# Patient Record
Sex: Male | Born: 1980
Health system: Southern US, Community
[De-identification: ages and names within clinical notes are randomized; demographics above are authoritative.]

## PROBLEM LIST (undated history)

## (undated) DIAGNOSIS — S86892S Other injury of other muscle(s) and tendon(s) at lower leg level, left leg, sequela: Secondary | ICD-10-CM

## (undated) DIAGNOSIS — S86012A Strain of left Achilles tendon, initial encounter: Secondary | ICD-10-CM

## (undated) DIAGNOSIS — M179 Osteoarthritis of knee, unspecified: Secondary | ICD-10-CM

## (undated) DIAGNOSIS — L409 Psoriasis, unspecified: Secondary | ICD-10-CM

## (undated) DIAGNOSIS — M1710 Unilateral primary osteoarthritis, unspecified knee: Secondary | ICD-10-CM

## (undated) HISTORY — DX: Other injury of other muscle(s) and tendon(s) at lower leg level, left leg, sequela: S86.892S

## (undated) HISTORY — DX: Osteoarthritis of knee, unspecified: M17.9

## (undated) HISTORY — PX: NASAL SEPTUM SURGERY: SHX37

## (undated) HISTORY — PX: CYST EXCISION: SHX5701

## (undated) HISTORY — DX: Unilateral primary osteoarthritis, unspecified knee: M17.10

## (undated) HISTORY — PX: FOOT FRACTURE SURGERY: SHX645

---

## 2001-09-09 ENCOUNTER — Ambulatory Visit (HOSPITAL_BASED_OUTPATIENT_CLINIC_OR_DEPARTMENT_OTHER): Admission: RE | Admit: 2001-09-09 | Discharge: 2001-09-09 | Payer: Self-pay | Admitting: Otolaryngology

## 2002-03-19 ENCOUNTER — Emergency Department (HOSPITAL_COMMUNITY): Admission: EM | Admit: 2002-03-19 | Discharge: 2002-03-19 | Payer: Self-pay | Admitting: Emergency Medicine

## 2002-03-19 ENCOUNTER — Encounter: Payer: Self-pay | Admitting: Emergency Medicine

## 2011-07-23 DIAGNOSIS — S335XXA Sprain of ligaments of lumbar spine, initial encounter: Secondary | ICD-10-CM | POA: Insufficient documentation

## 2013-04-18 ENCOUNTER — Emergency Department: Payer: Self-pay | Admitting: Internal Medicine

## 2013-04-18 LAB — SEDIMENTATION RATE: Erythrocyte Sed Rate: 1 mm/hr (ref 0–15)

## 2013-04-18 LAB — URIC ACID: Uric Acid: 7.7 mg/dL — ABNORMAL HIGH (ref 3.5–7.2)

## 2013-05-10 ENCOUNTER — Emergency Department (HOSPITAL_COMMUNITY)
Admission: EM | Admit: 2013-05-10 | Discharge: 2013-05-10 | Disposition: A | Discharge status: Home / Self Care | Payer: PRIVATE HEALTH INSURANCE | Attending: Emergency Medicine | Admitting: Emergency Medicine

## 2013-05-10 ENCOUNTER — Encounter (HOSPITAL_COMMUNITY): Payer: Self-pay | Admitting: Emergency Medicine

## 2013-05-10 DIAGNOSIS — Z791 Long term (current) use of non-steroidal anti-inflammatories (NSAID): Secondary | ICD-10-CM | POA: Insufficient documentation

## 2013-05-10 DIAGNOSIS — M7660 Achilles tendinitis, unspecified leg: Secondary | ICD-10-CM | POA: Insufficient documentation

## 2013-05-10 DIAGNOSIS — M7661 Achilles tendinitis, right leg: Secondary | ICD-10-CM

## 2013-05-10 MED ORDER — OXYCODONE-ACETAMINOPHEN 5-325 MG PO TABS: 1.0000 | ORAL_TABLET | ORAL | Status: DC | PRN

## 2013-05-10 MED ORDER — IBUPROFEN 800 MG PO TABS: 800.0000 mg | ORAL_TABLET | Freq: Three times a day (TID) | ORAL | Status: DC

## 2013-05-10 NOTE — ED Notes (Signed)
Pt alert, arrives from home, c/o left foot(tendon) pain, onset was yesterday, denies trauma or injury, resp even unlabored, skin pwd

## 2013-05-18 ENCOUNTER — Ambulatory Visit (INDEPENDENT_AMBULATORY_CARE_PROVIDER_SITE_OTHER): Payer: 59 | Admitting: Internal Medicine

## 2013-05-18 VITALS — BP 124/88 | HR 82 | Temp 98.3°F | Resp 18 | Ht 70.0 in | Wt 220.0 lb

## 2013-05-18 DIAGNOSIS — M7660 Achilles tendinitis, unspecified leg: Secondary | ICD-10-CM

## 2013-05-18 DIAGNOSIS — M7661 Achilles tendinitis, right leg: Secondary | ICD-10-CM

## 2013-05-18 MED ORDER — OXYCODONE-ACETAMINOPHEN 5-325 MG PO TABS: 1.0000 | ORAL_TABLET | Freq: Four times a day (QID) | ORAL | Status: DC | PRN

## 2013-05-18 NOTE — Progress Notes (Signed)
  Subjective:    Patient ID: Frank Wall, male    DOB: Aug 19, 1981, 32 y.o.   MRN: [id number]  HPI Was in ER about 10 days ago with flare of achilles tendonitis. Was off feet for several days, was pain free after resting and ibuprofen/percocet. Had to move yesterday and started having recurrent pain last night. Has seen Dr. Brynda Greathouse from ortho in past and has an appointment with him in 9 days. [place]. Had complicated break of foot when he was 7. Has had prednisone in the past, but did not think it was helpful and it made him feel weird. Last week, took 2 percocet in morning, then a couple more per day for a couple of days. Was off for several days. Pain worse in the mornings.  Review of Systems     Objective:   Physical Exam Male in NAD, using crutches for ambulation.  Right foot flexion to 45 degrees, painful. Minimal achilles swelling, but exquisitely tender to touch. Negative Thompson's sign. Foot with normal color, temperature, distal pulses present, brisk cap refill.  Unable to bear weight without significant pain    Assessment & Plan:  Achilles tendinitis, right 1- continue to use crutches, with no weight bearing until he sees Dr. Brynda Greathouse (ortho) 2- Meds ordered this encounter  Medications  . oxyCODONE-acetaminophen (PERCOCET/ROXICET) 5-325 MG per tablet    Sig: Take 1 tablet by mouth every 6 (six) hours as needed for pain.    Dispense:  20 tablet    Refill:  0   reviewed nccsrs  Attending statement: I was fully involved in this evaluation, exam, and plan and have reviewed the document and agree. Robert P. Sandria Bales.D.

## 2013-05-18 NOTE — ED Provider Notes (Signed)
CSN: [id number]     Arrival date & time 05/10/13  0804 History   First MD Initiated Contact with Patient 05/10/13 [phone]     Chief Complaint  Patient presents with  . Foot Pain    Right   (Consider location/radiation/quality/duration/timing/severity/associated sxs/prior Treatment) Patient is a 32 y.o. male presenting with lower extremity pain. The history is provided by the patient. No language interpreter was used.  Foot Pain This is a recurrent problem. The current episode started yesterday. Pertinent negatives include no chills or fever. Associated symptoms comments: He has a history of Achilles tendonitis with recurrent symptoms that started yesterday. No known injury. Minimal swelling. Marland Kitchen    History reviewed. No pertinent past medical history. History reviewed. No pertinent past surgical history. Family History  Problem Relation Age of Onset  . Diabetes Father    History  Substance Use Topics  . Smoking status: Never Smoker   . Smokeless tobacco: Never Used  . Alcohol Use: No    Review of Systems  Constitutional: Negative for fever and chills.  Musculoskeletal: Negative.        See HPI  Skin: Negative.   Neurological: Negative.     Allergies  Review of patient's allergies indicates no known allergies.  Home Medications   Current Outpatient Rx  Name  Route  Sig  Dispense  Refill  . naproxen sodium (ANAPROX) 220 MG tablet   Oral   Take 220 mg by mouth 2 (two) times daily with a meal.         . ibuprofen (ADVIL,MOTRIN) 800 MG tablet   Oral   Take 1 tablet (800 mg total) by mouth 3 (three) times daily.   21 tablet   0   . oxyCODONE-acetaminophen (PERCOCET/ROXICET) 5-325 MG per tablet   Oral   Take 1 tablet by mouth every 6 (six) hours as needed for pain.   20 tablet   0    BP 143/89  Pulse 98  Temp(Src) 98 F (36.7 C) (Oral)  Resp 16  Wt 220 lb (99.791 kg)  SpO2 100% Physical Exam  Constitutional: He is oriented to person, place, and time. He appears  well-developed and well-nourished.  Neck: Normal range of motion.  Pulmonary/Chest: Effort normal.  Musculoskeletal: Normal range of motion.  Significant lender to left posterior distal lower leg and ankle. No tendon laxity. Stable joint. NO redness or lesion.  Neurological: He is alert and oriented to person, place, and time.  Skin: Skin is warm and dry.  Psychiatric: He has a normal mood and affect.    ED Course  Procedures (including critical care time) Labs Review Labs Reviewed - No data to display Imaging Review No results found.  MDM   1. Tendonitis, Achilles, right    Recurrent Achilles tendonitis without complication. He has ortho follow up in place.     Arnoldo Hooker, PA-C 05/18/13 1953

## 2013-05-19 NOTE — ED Provider Notes (Signed)
Medical screening examination/treatment/procedure(s) were performed by non-physician practitioner and as supervising physician I was immediately available for consultation/collaboration.   Richardean Canal, MD 05/19/13 [phone]

## 2013-10-13 ENCOUNTER — Emergency Department: Payer: Self-pay | Admitting: Emergency Medicine

## 2013-10-18 ENCOUNTER — Emergency Department: Payer: Self-pay | Admitting: Internal Medicine

## 2014-03-05 ENCOUNTER — Emergency Department (HOSPITAL_COMMUNITY)
Admission: EM | Admit: 2014-03-05 | Discharge: 2014-03-05 | Disposition: A | Discharge status: Home / Self Care | Payer: PRIVATE HEALTH INSURANCE | Attending: Emergency Medicine | Admitting: Emergency Medicine

## 2014-03-05 ENCOUNTER — Emergency Department (HOSPITAL_COMMUNITY): Payer: PRIVATE HEALTH INSURANCE

## 2014-03-05 ENCOUNTER — Encounter (HOSPITAL_COMMUNITY): Payer: Self-pay | Admitting: Emergency Medicine

## 2014-03-05 DIAGNOSIS — M722 Plantar fascial fibromatosis: Secondary | ICD-10-CM | POA: Insufficient documentation

## 2014-03-05 DIAGNOSIS — Z791 Long term (current) use of non-steroidal anti-inflammatories (NSAID): Secondary | ICD-10-CM | POA: Insufficient documentation

## 2014-03-05 MED ORDER — OXYCODONE-ACETAMINOPHEN 5-325 MG PO TABS: 1.0000 | ORAL_TABLET | ORAL | Status: DC | PRN

## 2014-03-05 MED ORDER — OXYCODONE-ACETAMINOPHEN 5-325 MG PO TABS
2.0000 | ORAL_TABLET | Freq: Once | ORAL | Status: AC
  Administered 2014-03-05: 2 via ORAL
  Filled 2014-03-05: qty 2

## 2014-03-05 NOTE — ED Notes (Signed)
Per pt, states history of right foot problems-no injury 4 days ago, now right foot swollen, difficult to bear weight

## 2014-03-05 NOTE — ED Provider Notes (Signed)
CSN: [id number]     Arrival date & time 03/05/14  1038 History   First MD Initiated Contact with Patient 03/05/14 1102     Chief Complaint  Patient presents with  . Foot Pain     (Consider location/radiation/quality/duration/timing/severity/associated sxs/prior Treatment) HPI Comments: Pt state that he has a history of achilles tendonitis and has seen multiple specialist and has gotten only short term intermittent relief. Denies numbness, redness or weakness. Pain worse with walking  Patient is a 33 y.o. male presenting with lower extremity pain. The history is provided by the patient. No language interpreter was used.  Foot Pain This is a recurrent problem. The current episode started in the past 7 days. The problem occurs constantly. The problem has been gradually worsening. Pertinent negatives include no fever or joint swelling. The symptoms are aggravated by walking. He has tried NSAIDs, ice and heat for the symptoms.    History reviewed. No pertinent past medical history. History reviewed. No pertinent past surgical history. Family History  Problem Relation Age of Onset  . Diabetes Father    History  Substance Use Topics  . Smoking status: Never Smoker   . Smokeless tobacco: Never Used  . Alcohol Use: No    Review of Systems  Constitutional: Negative for fever.  Respiratory: Negative.   Cardiovascular: Negative.   Musculoskeletal: Negative for joint swelling.      Allergies  Review of patient's allergies indicates no known allergies.  Home Medications   Prior to Admission medications   Medication Sig Start Date End Date Taking? Authorizing Provider  ibuprofen (ADVIL,MOTRIN) 800 MG tablet Take 1 tablet (800 mg total) by mouth 3 (three) times daily. 05/10/13   Shari A Upstill, PA-C  naproxen sodium (ANAPROX) 220 MG tablet Take 220 mg by mouth 2 (two) times daily with a meal.    Historical Provider, MD  oxyCODONE-acetaminophen (PERCOCET/ROXICET) 5-325 MG per tablet  Take 1 tablet by mouth every 6 (six) hours as needed for pain. 05/18/13   Tonye Pearsonobert P Doolittle, MD   BP 143/97  Pulse 98  Temp(Src) 98 F (36.7 C) (Oral)  Resp 18  SpO2 100% Physical Exam  Nursing note and vitals reviewed. Constitutional: He is oriented to person, place, and time. He appears well-developed and well-nourished.  Cardiovascular: Normal rate and regular rhythm.   Pulmonary/Chest: Effort normal and breath sounds normal.  Musculoskeletal:  Tender on the bottom of the heal up thru the instep of the right foot.no redness noted. Pulses intact  Neurological: He is alert and oriented to person, place, and time. Coordination normal.  Skin: Skin is warm and dry.    ED Course  Procedures (including critical care time) Labs Review Labs Reviewed - No data to display  Imaging Review Dg Foot Complete Right  03/05/2014   CLINICAL DATA:  Pain and swelling.  EXAM: RIGHT FOOT COMPLETE - 3+ VIEW  COMPARISON:  None.  FINDINGS: There is minimal degenerative change of the first interphalangeal joint. There is no acute fracture dislocation.  IMPRESSION: No acute findings.   Electronically Signed   By: Elberta Fortisaniel  Boyle M.D.   On: 03/05/2014 12:53     EKG Interpretation None      MDM   Final diagnoses:  Plantar fasciitis    Will give percocet for pain. Pt instructed on symptomatic relief. Pt has ortho follow up    Teressa LowerVrinda Elye Harmsen, NP 03/05/14 1304

## 2014-03-05 NOTE — Discharge Instructions (Signed)
Plantar Fasciitis (Heel Spur Syndrome)  with Rehab  The plantar fascia is a fibrous, ligament-like, soft-tissue structure that spans the bottom of the foot. Plantar fasciitis is a condition that causes pain in the foot due to inflammation of the tissue.  SYMPTOMS   · Pain and tenderness on the underneath side of the foot.  · Pain that worsens with standing or walking.  CAUSES   Plantar fasciitis is caused by irritation and injury to the plantar fascia on the underneath side of the foot. Common mechanisms of injury include:  · Direct trauma to bottom of the foot.  · Damage to a small nerve that runs under the foot where the main fascia attaches to the heel bone.  · Stress placed on the plantar fascia due to bone spurs.  RISK INCREASES WITH:   · Activities that place stress on the plantar fascia (running, jumping, pivoting, or cutting).  · Poor strength and flexibility.  · Improperly fitted shoes.  · Tight calf muscles.  · Flat feet.  · Failure to warm-up properly before activity.  · Obesity.  PREVENTION  · Warm up and stretch properly before activity.  · Allow for adequate recovery between workouts.  · Maintain physical fitness:  · Strength, flexibility, and endurance.  · Cardiovascular fitness.  · Maintain a health body weight.  · Avoid stress on the plantar fascia.  · Wear properly fitted shoes, including arch supports for individuals who have flat feet.  PROGNOSIS   If treated properly, then the symptoms of plantar fasciitis usually resolve without surgery. However, occasionally surgery is necessary.  RELATED COMPLICATIONS   · Recurrent symptoms that may result in a chronic condition.  · Problems of the lower back that are caused by compensating for the injury, such as limping.  · Pain or weakness of the foot during push-off following surgery.  · Chronic inflammation, scarring, and partial or complete fascia tear, occurring more often from repeated injections.  TREATMENT   Treatment initially involves the use of  ice and medication to help reduce pain and inflammation. The use of strengthening and stretching exercises may help reduce pain with activity, especially stretches of the Achilles tendon. These exercises may be performed at home or with a therapist. Your caregiver may recommend that you use heel cups of arch supports to help reduce stress on the plantar fascia. Occasionally, corticosteroid injections are given to reduce inflammation. If symptoms persist for greater than 6 months despite non-surgical (conservative), then surgery may be recommended.   MEDICATION   · If pain medication is necessary, then nonsteroidal anti-inflammatory medications, such as aspirin and ibuprofen, or other minor pain relievers, such as acetaminophen, are often recommended.  · Do not take pain medication within 7 days before surgery.  · Prescription pain relievers may be given if deemed necessary by your caregiver. Use only as directed and only as much as you need.  · Corticosteroid injections may be given by your caregiver. These injections should be reserved for the most serious cases, because they may only be given a certain number of times.  HEAT AND COLD  · Cold treatment (icing) relieves pain and reduces inflammation. Cold treatment should be applied for 10 to 15 minutes every 2 to 3 hours for inflammation and pain and immediately after any activity that aggravates your symptoms. Use ice packs or massage the area with a piece of ice (ice massage).  · Heat treatment may be used prior to performing the stretching and strengthening activities prescribed   by your caregiver, physical therapist, or athletic trainer. Use a heat pack or soak the injury in warm water.  SEEK IMMEDIATE MEDICAL CARE IF:  · Treatment seems to offer no benefit, or the condition worsens.  · Any medications produce adverse side effects.  EXERCISES  RANGE OF MOTION (ROM) AND STRETCHING EXERCISES - Plantar Fasciitis (Heel Spur Syndrome)  These exercises may help you  when beginning to rehabilitate your injury. Your symptoms may resolve with or without further involvement from your physician, physical therapist or athletic trainer. While completing these exercises, remember:   · Restoring tissue flexibility helps normal motion to return to the joints. This allows healthier, less painful movement and activity.  · An effective stretch should be held for at least 30 seconds.  · A stretch should never be painful. You should only feel a gentle lengthening or release in the stretched tissue.  RANGE OF MOTION - Toe Extension, Flexion  · Sit with your right / left leg crossed over your opposite knee.  · Grasp your toes and gently pull them back toward the top of your foot. You should feel a stretch on the bottom of your toes and/or foot.  · Hold this stretch for __________ seconds.  · Now, gently pull your toes toward the bottom of your foot. You should feel a stretch on the top of your toes and or foot.  · Hold this stretch for __________ seconds.  Repeat __________ times. Complete this stretch __________ times per day.   RANGE OF MOTION - Ankle Dorsiflexion, Active Assisted  · Remove shoes and sit on a chair that is preferably not on a carpeted surface.  · Place right / left foot under knee. Extend your opposite leg for support.  · Keeping your heel down, slide your right / left foot back toward the chair until you feel a stretch at your ankle or calf. If you do not feel a stretch, slide your bottom forward to the edge of the chair, while still keeping your heel down.  · Hold this stretch for __________ seconds.  Repeat __________ times. Complete this stretch __________ times per day.   STRETCH  Gastroc, Standing  · Place hands on wall.  · Extend right / left leg, keeping the front knee somewhat bent.  · Slightly point your toes inward on your back foot.  · Keeping your right / left heel on the floor and your knee straight, shift your weight toward the wall, not allowing your back to  arch.  · You should feel a gentle stretch in the right / left calf. Hold this position for __________ seconds.  Repeat __________ times. Complete this stretch __________ times per day.  STRETCH  Soleus, Standing  · Place hands on wall.  · Extend right / left leg, keeping the other knee somewhat bent.  · Slightly point your toes inward on your back foot.  · Keep your right / left heel on the floor, bend your back knee, and slightly shift your weight over the back leg so that you feel a gentle stretch deep in your back calf.  · Hold this position for __________ seconds.  Repeat __________ times. Complete this stretch __________ times per day.  STRETCH  Gastrocsoleus, Standing   Note: This exercise can place a lot of stress on your foot and ankle. Please complete this exercise only if specifically instructed by your caregiver.   · Place the ball of your right / left foot on a step, keeping   your other foot firmly on the same step.  · Hold on to the wall or a rail for balance.  · Slowly lift your other foot, allowing your body weight to press your heel down over the edge of the step.  · You should feel a stretch in your right / left calf.  · Hold this position for __________ seconds.  · Repeat this exercise with a slight bend in your right / left knee.  Repeat __________ times. Complete this stretch __________ times per day.   STRENGTHENING EXERCISES - Plantar Fasciitis (Heel Spur Syndrome)   These exercises may help you when beginning to rehabilitate your injury. They may resolve your symptoms with or without further involvement from your physician, physical therapist or athletic trainer. While completing these exercises, remember:   · Muscles can gain both the endurance and the strength needed for everyday activities through controlled exercises.  · Complete these exercises as instructed by your physician, physical therapist or athletic trainer. Progress the resistance and repetitions only as guided.  STRENGTH - Towel  Curls  · Sit in a chair positioned on a non-carpeted surface.  · Place your foot on a towel, keeping your heel on the floor.  · Pull the towel toward your heel by only curling your toes. Keep your heel on the floor.  · If instructed by your physician, physical therapist or athletic trainer, add ____________________ at the end of the towel.  Repeat __________ times. Complete this exercise __________ times per day.  STRENGTH - Ankle Inversion  · Secure one end of a rubber exercise band/tubing to a fixed object (table, pole). Loop the other end around your foot just before your toes.  · Place your fists between your knees. This will focus your strengthening at your ankle.  · Slowly, pull your big toe up and in, making sure the band/tubing is positioned to resist the entire motion.  · Hold this position for __________ seconds.  · Have your muscles resist the band/tubing as it slowly pulls your foot back to the starting position.  Repeat __________ times. Complete this exercises __________ times per day.   Document Released: 09/10/2005 Document Revised: 12/03/2011 Document Reviewed: 12/23/2008  Exit[employer]® Patient Information ©2014 [employer].

## 2014-03-10 NOTE — ED Provider Notes (Signed)
Medical screening examination/treatment/procedure(s) were performed by non-physician practitioner and as supervising physician I was immediately available for consultation/collaboration.    Richardine Peppers, MD 03/10/14 1901 

## 2014-04-07 ENCOUNTER — Emergency Department (HOSPITAL_COMMUNITY)
Admission: EM | Admit: 2014-04-07 | Discharge: 2014-04-07 | Disposition: A | Discharge status: Home / Self Care | Payer: PRIVATE HEALTH INSURANCE | Attending: Emergency Medicine | Admitting: Emergency Medicine

## 2014-04-07 ENCOUNTER — Encounter (HOSPITAL_COMMUNITY): Payer: Self-pay | Admitting: Emergency Medicine

## 2014-04-07 DIAGNOSIS — M7661 Achilles tendinitis, right leg: Secondary | ICD-10-CM

## 2014-04-07 DIAGNOSIS — M7660 Achilles tendinitis, unspecified leg: Secondary | ICD-10-CM | POA: Insufficient documentation

## 2014-04-07 DIAGNOSIS — IMO0002 Reserved for concepts with insufficient information to code with codable children: Secondary | ICD-10-CM | POA: Insufficient documentation

## 2014-04-07 DIAGNOSIS — Z8781 Personal history of (healed) traumatic fracture: Secondary | ICD-10-CM | POA: Insufficient documentation

## 2014-04-07 MED ORDER — PREDNISONE 10 MG PO TABS: ORAL_TABLET | ORAL | Status: DC

## 2014-04-07 MED ORDER — OXYCODONE-ACETAMINOPHEN 5-325 MG PO TABS: 1.0000 | ORAL_TABLET | Freq: Four times a day (QID) | ORAL | Status: DC | PRN

## 2014-04-07 NOTE — ED Notes (Addendum)
Per pt-has broken right foot x2 with improper healing. Was seeing specialist but recently lost insurance so isn't able to see the specialist anymore. Has had multiple MRIs and was told "there is something wrong with the integrity of the Achilles tendon." States "I've been told everything from bone spurs to plantar fascitis." Says major pain is in achilles tendon area. Has limited ROM with right foot. 1+ pedal pulses. Denies numbness or tingling in foot. Had two 5 mg Percocet yesterday which "took the edge off." Took 4-5 Aleve around 1100 today. Awaiting PA.

## 2014-04-07 NOTE — Discharge Instructions (Signed)
Achilles Tendinitis Achilles tendinitis is inflammation of the tough, cord-like band that attaches the lower muscles of your leg to your heel (Achilles tendon). It is usually caused by overusing the tendon and joint involved.  CAUSES Achilles tendinitis can happen because of:  A sudden increase in exercise or activity (such as running).  Doing the same exercises or activities (such as jumping) over and over.  Not warming up calf muscles before exercising.  Exercising in shoes that are worn out or not made for exercise.  Having arthritis or a bone growth on the back of the heel bone. This can rub against the tendon and hurt the tendon. SIGNS AND SYMPTOMS The most common symptoms are:  Pain in the back of the leg, just above the heel. The pain usually gets worse with exercise and better with rest.  Stiffness or soreness in the back of the leg, especially in the morning.  Swelling of the skin over the Achilles tendon.  Trouble standing on tiptoe. Sometimes, an Achilles tendon tears (ruptures). Symptoms of an Achilles tendon rupture can include:  Sudden, severe pain in the back of the leg.  Trouble putting weight on the foot or walking normally. DIAGNOSIS Achilles tendinitis will be diagnosed based on symptoms and a physical examination. An X-ray may be done to check if another condition is causing your symptoms. An MRI may be ordered if your health care provider suspects you may have completely torn your tendon, which is called an Achilles tendon rupture.  TREATMENT  Achilles tendinitis usually gets better over time. It can take weeks to months to heal completely. Treatment focuses on treating the symptoms and helping the injury heal. HOME CARE INSTRUCTIONS   Rest your Achilles tendon and avoid activities that cause pain.  Apply ice to the injured area:  Put ice in a plastic bag.  Place a towel between your skin and the bag.  Leave the ice on for 20 minutes, 2-3 times a  day  Try to avoid using the tendon (other than gentle range of motion) while the tendon is painful. Do not resume use until instructed by your health care provider. Then begin use gradually. Do not increase use to the point of pain. If pain does develop, decrease use and continue the above measures. Gradually increase activities that do not cause discomfort until you achieve normal use.  Do exercises to make your calf muscles stronger and more flexible. Your health care provider or physical therapist can recommend exercises for you to do.  Wrap your ankle with an elastic bandage or other wrap. This can help keep your tendon from moving too much. Your health care provider will show you how to wrap your ankle correctly.  Only take over-the-counter or prescription medicines for pain, discomfort, or fever as directed by your health care provider. SEEK MEDICAL CARE IF:   Your pain and swelling increase or pain is uncontrolled with medicines.  You develop new, unexplained symptoms or your symptoms get worse.  You are unable to move your toes or foot.  You develop warmth and swelling in your foot.  You have an unexplained temperature. MAKE SURE YOU:   Understand these instructions.  Will watch your condition.  Will get help right away if you are not doing well or get worse. Document Released: 06/20/2005 Document Revised: 07/01/2013 Document Reviewed: 04/22/2013 Exit[employer] Patient Information 2015 [employer]. This information is not intended to replace advice given to you by your health care provider. Make sure you discuss   any questions you have with your health care provider.  

## 2014-04-07 NOTE — ED Provider Notes (Signed)
CSN: [id number]     Arrival date & time 04/07/14  1439 History  This chart was scribed for non-physician practitioner, Eben Burowourtney A Forcucci, PA-C, working with Audree CamelScott T Goldston, MD by Charline BillsEssence Howell, ED Scribe. This patient was seen in room WTR5/WTR5 and the patient's care was started at 3:29 PM.   Chief Complaint  Patient presents with  . Foot Injury    recurrent   The history is provided by the patient. No language interpreter was used.   HPI Comments: Frank Wall is a 33 y.o. male who presents to the Emergency Department complaining of constant R foot pain, worsened in the achilles tendon over the past few days. Pt states that he has had recurrent foot pain over the past 3 years and has broken his foot twice; no surgery. He currently rates the pain 8/10 and 2/10 on a daily basis. He reports associated mild swelling. Pt states that he lost his insurance and is unable to follow-up with a specialist anymore. Pt states that he has has multiple MRIs done, most recent 6 months ago, and was told that his Achilles tendon had pieces missing. He denies injury. He also denies redness or warmth to the area, nausea, vomiting, chills, calf tenderness, numbness/tingling. Pt states that he was able to ambulate with a limp yesterday and has to hop today on his L foot. Pt has tried Aleve, left over Oxycodone last night with temporary relief and at home exercises.  History reviewed. No pertinent past medical history. History reviewed. No pertinent past surgical history. Family History  Problem Relation Age of Onset  . Diabetes Father    History  Substance Use Topics  . Smoking status: Never Smoker   . Smokeless tobacco: Never Used  . Alcohol Use: Yes     Comment: occasional     Review of Systems  Constitutional: Negative for chills.  Gastrointestinal: Negative for nausea and vomiting.  Musculoskeletal: Positive for joint swelling and myalgias.  Neurological: Negative for numbness.  All other  systems reviewed and are negative.  Allergies  Review of patient's allergies indicates no known allergies.  Home Medications   Prior to Admission medications   Medication Sig Start Date End Date Taking? Authorizing Provider  ibuprofen (ADVIL,MOTRIN) 400 MG tablet Take 800 mg by mouth every 6 (six) hours as needed for moderate pain.    Historical Provider, MD  oxyCODONE-acetaminophen (PERCOCET/ROXICET) 5-325 MG per tablet Take 1-2 tablets by mouth every 4 (four) hours as needed for moderate pain or severe pain. 03/05/14   Teressa LowerVrinda Pickering, NP  oxyCODONE-acetaminophen (PERCOCET/ROXICET) 5-325 MG per tablet Take 1-2 tablets by mouth every 6 (six) hours as needed for moderate pain or severe pain. 04/07/14   Jarvis Knodel A Forcucci, PA-C  predniSONE (DELTASONE) 10 MG tablet Day 1 take 6 pills Day 2 take 5 pills Day 3 take 4 pills Day 4 take 3 pills Day 5 take 2 pills Day 6 take 1 pill 04/07/14   Naela Nodal A Forcucci, PA-C   Triage Vitals: BP 134/89  Pulse 92  Temp(Src) 98.1 F (36.7 C) (Oral)  Resp 18  SpO2 100% Physical Exam  Nursing note and vitals reviewed. Constitutional: He is oriented to person, place, and time. He appears well-developed and well-nourished. No distress.  HENT:  Head: Normocephalic and atraumatic.  Mouth/Throat: Oropharynx is clear and moist. No oropharyngeal exudate.  Eyes: Conjunctivae are normal. No scleral icterus.  Neck: Normal range of motion. Neck supple. No thyromegaly present.  Cardiovascular: Normal rate, regular rhythm,  normal heart sounds and intact distal pulses.  Exam reveals no gallop and no friction rub.   No murmur heard. Pulmonary/Chest: Effort normal and breath sounds normal. No respiratory distress. He has no wheezes. He has no rales. He exhibits no tenderness.  Musculoskeletal:       Right ankle: He exhibits decreased range of motion and swelling. He exhibits no ecchymosis, no deformity, no laceration and normal pulse. Tenderness. No lateral  malleolus, no medial malleolus, no AITFL, no CF ligament, no posterior TFL, no head of 5th metatarsal and no proximal fibula tenderness found. Achilles tendon exhibits pain. Achilles tendon exhibits no defect and normal Thompson's test results.  Lymphadenopathy:    He has no cervical adenopathy.  Neurological: He is alert and oriented to person, place, and time.  Skin: Skin is warm and dry. He is not diaphoretic.  Psychiatric: He has a normal mood and affect. His behavior is normal. Judgment and thought content normal.    ED Course  Procedures (including critical care time) DIAGNOSTIC STUDIES: Oxygen Saturation is 100% on RA, normal by my interpretation.    COORDINATION OF CARE: 3:42 PM-Discussed treatment plan which includes medication for pain, referral to [facility], Prednisone and return precautions with pt at bedside and pt agreed to plan.   Labs Review Labs Reviewed - No data to display  Imaging Review No results found.   EKG Interpretation None      MDM   Final diagnoses:  Achilles tendinitis of right lower extremity   Patient is a 33 y.o. Male with history of right foot pain and achilles tendonitis.  Physical exam and history today consistent with achilles tendonitis pain.  Patient has negative thompsons test here and no palpable defect.  Patient has cam walker at home, suggest wearing with heal lift.  Will give 6 day prednisone taper, and 2 days oxycodone.  I have referred him to [facility] and wellness at this time.  Patient denies ortho referral at this time given no insurance.  Patient was given septic joint symptoms as strict return precautions.  He states his understanding at this time.  I personally performed the services described in this documentation, which was scribed in my presence. The recorded information has been reviewed and is accurate.    Eben Burow, PA-C 04/07/14 2038

## 2014-04-08 NOTE — ED Provider Notes (Signed)
Medical screening examination/treatment/procedure(s) were performed by non-physician practitioner and as supervising physician I was immediately available for consultation/collaboration.   EKG Interpretation None        Audree CamelScott T Javante Nilsson, MD 04/08/14 1725

## 2014-06-01 ENCOUNTER — Emergency Department (HOSPITAL_COMMUNITY)
Admission: EM | Admit: 2014-06-01 | Discharge: 2014-06-01 | Disposition: A | Discharge status: Home / Self Care | Payer: PRIVATE HEALTH INSURANCE | Attending: Emergency Medicine | Admitting: Emergency Medicine

## 2014-06-01 ENCOUNTER — Encounter (HOSPITAL_COMMUNITY): Payer: Self-pay | Admitting: Emergency Medicine

## 2014-06-01 DIAGNOSIS — Z791 Long term (current) use of non-steroidal anti-inflammatories (NSAID): Secondary | ICD-10-CM | POA: Insufficient documentation

## 2014-06-01 DIAGNOSIS — M25579 Pain in unspecified ankle and joints of unspecified foot: Secondary | ICD-10-CM | POA: Insufficient documentation

## 2014-06-01 DIAGNOSIS — M25571 Pain in right ankle and joints of right foot: Secondary | ICD-10-CM

## 2014-06-01 DIAGNOSIS — IMO0002 Reserved for concepts with insufficient information to code with codable children: Secondary | ICD-10-CM | POA: Insufficient documentation

## 2014-06-01 DIAGNOSIS — Z79899 Other long term (current) drug therapy: Secondary | ICD-10-CM | POA: Insufficient documentation

## 2014-06-01 MED ORDER — MELOXICAM 15 MG PO TABS: 15.0000 mg | ORAL_TABLET | Freq: Every day | ORAL | Status: DC

## 2014-06-01 MED ORDER — KETOROLAC TROMETHAMINE 60 MG/2ML IM SOLN
60.0000 mg | Freq: Once | INTRAMUSCULAR | Status: AC
  Administered 2014-06-01: 60 mg via INTRAMUSCULAR
  Filled 2014-06-01: qty 2

## 2014-06-01 MED ORDER — TRAMADOL HCL 50 MG PO TABS: 50.0000 mg | ORAL_TABLET | Freq: Four times a day (QID) | ORAL | Status: DC | PRN

## 2014-06-01 MED ORDER — DEXAMETHASONE SODIUM PHOSPHATE 10 MG/ML IJ SOLN
10.0000 mg | Freq: Once | INTRAMUSCULAR | Status: AC
  Administered 2014-06-01: 10 mg via INTRAMUSCULAR
  Filled 2014-06-01: qty 1

## 2014-06-01 NOTE — ED Notes (Addendum)
Patient states that he just woke up on Sunday with extreme pain to the right ankle. He denies injury or physical activity that would explain the sudden onset of pain. He has broken it in the past a few years ago. He used ice and heat but was unable to bear weight. He took aleve with no relief. He is currently uses a set of crutches to help with the pain.

## 2014-06-01 NOTE — Discharge Instructions (Signed)

## 2014-06-01 NOTE — ED Provider Notes (Signed)
CSN: [id number]     Arrival date & time 06/01/14  1442 History   First MD Initiated Contact with Patient 06/01/14 1525    This chart was scribed for Arthor Captain, PA, with Juliet Rude. Rubin Payor, MD by Tonye Royalty, ED Scribe. This patient was seen in room WTR6/WTR6 and the patient's care was started at 3:30 PM.    Chief Complaint  Patient presents with  . Ankle Pain   The history is provided by the patient. No language interpreter was used.   HPI Comments: Frank Wall is a 33 y.o. male who presents to the Emergency Department complaining of right ankle pain with onset 2 days ago upon waking. He states the pain is to his lateral and dorsal area over his tarsals and lateral malleolus. He reports previously breaking the foot twice and the ankle once, and he reports occasional flare-ups of pain. He reports previouos MRI showed that it healed wrong. He denies recent injury. He denies associated heat to his ankle and reports previous negative gout workup. He reports using ice, heat, and Aleve without remission of pain. He reports usually doing a 7 day steroid taper and pain medications when he has flare-ups. He reports psoriasis but no other autoimmune disorders.   History reviewed. No pertinent past medical history. History reviewed. No pertinent past surgical history. Family History  Problem Relation Age of Onset  . Diabetes Father    History  Substance Use Topics  . Smoking status: Never Smoker   . Smokeless tobacco: Never Used  . Alcohol Use: Yes     Comment: occasional     Review of Systems  Musculoskeletal:       Right ankle pain  Neurological: Negative for numbness.      Allergies  Review of patient's allergies indicates no known allergies.  Home Medications   Prior to Admission medications   Medication Sig Start Date End Date Taking? Authorizing Provider  calcipotriene-betamethasone (TACLONEX) ointment Apply 1 application topically 2 (two) times daily. For psoriasis    Yes Historical Provider, MD  naproxen (NAPROSYN) 500 MG tablet Take 500 mg by mouth 2 (two) times daily with a meal.   Yes Historical Provider, MD  naproxen sodium (ALEVE) 220 MG tablet Take 220-440 mg by mouth 2 (two) times daily as needed (for pain).   Yes Historical Provider, MD  Tetrahydrozoline HCl ([employer] E[facility] DROPS OP) Place 1 drop into both eyes daily.   Yes Historical Provider, MD   BP 131/100  Pulse 105  Temp(Src) 98.9 F (37.2 C) (Oral)  Resp 20  SpO2 92% Physical Exam  Nursing note and vitals reviewed. Constitutional: He is oriented to person, place, and time. He appears well-developed and well-nourished.  HENT:  Head: Normocephalic and atraumatic.  Eyes: Conjunctivae are normal.  Neck: Normal range of motion. Neck supple.  Pulmonary/Chest: Effort normal.  Musculoskeletal:  soft palpable 4cm lump on dorsal side of foot that gets worse with flare-ups, tender to palpation on lateral and dorsal side, limited ROM secondary to pain. Significant swelling to entire right foot, particularly over lateral tarsals   Neurological: He is alert and oriented to person, place, and time.  Skin: Skin is warm and dry.  Psychiatric: He has a normal mood and affect.    ED Course  Procedures (including critical care time) Labs Review Labs Reviewed - No data to display  Imaging Review No results found.   EKG Interpretation None     DIAGNOSTIC STUDIES: Oxygen Saturation is 92% on  room air, adequate by my interpretation.    COORDINATION OF CARE:   MDM   Final diagnoses:  Acute ankle pain, right    Medications  ketorolac (TORADOL) injection 60 mg (60 mg Intramuscular Given 06/01/14 1647)  dexamethasone (DECADRON) injection 10 mg (10 mg Intramuscular Given 06/01/14 1647)    Patient with apparent inflammatory process of the ankle. No signs of infection.  Exam is not consistent with gout.  meds given as above. . D/c with pain meds/ antiinflammatories  I personally performed the  services described in this documentation, which was scribed in my presence. The recorded information has been reviewed and is accurate.      Arthor Captain, PA-C 06/07/14 2203

## 2014-06-05 ENCOUNTER — Emergency Department: Payer: Self-pay | Admitting: Emergency Medicine

## 2014-06-08 NOTE — ED Provider Notes (Signed)
Medical screening examination/treatment/procedure(s) were performed by non-physician practitioner and as supervising physician I was immediately available for consultation/collaboration.   EKG Interpretation None       Phinley Schall R. Jairus Tonne, MD 06/08/14 0653 

## 2015-07-18 IMAGING — CR RIGHT FOOT COMPLETE - 3+ VIEW
1 series · 3 of 3 positions shown · non-contrast
Comparison: 03/05/2014.

CLINICAL DATA: Pain and swelling of the RIGHT foot. Swelling across
the metatarsals.

EXAM:
RIGHT FOOT COMPLETE - 3+ VIEW

[Series 1: ap · 0.17mm/px · 3 of 3 slices shown]
[im 1/3]
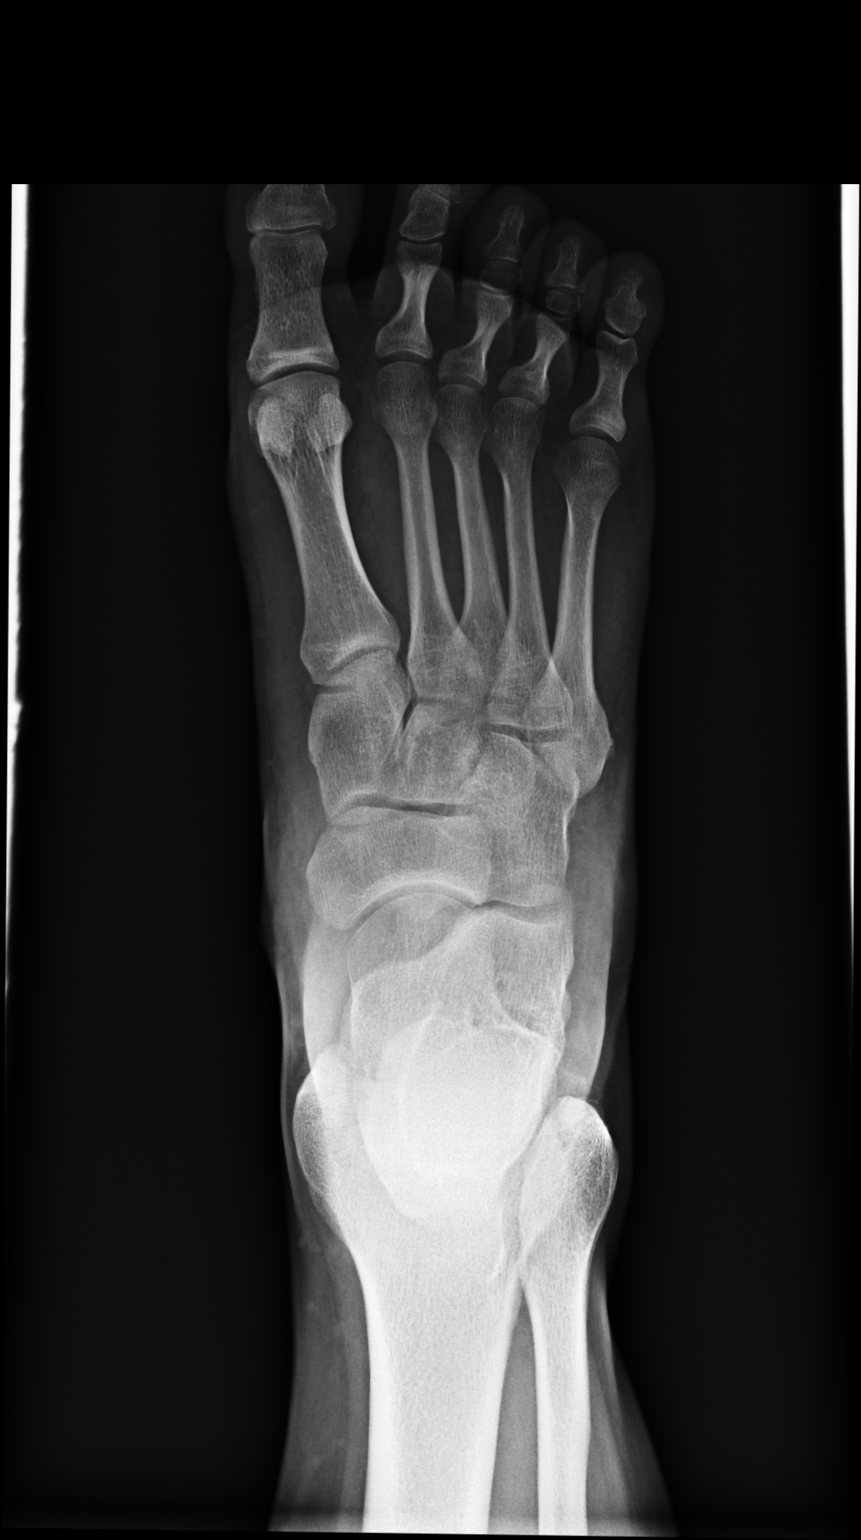
[im 2/3]
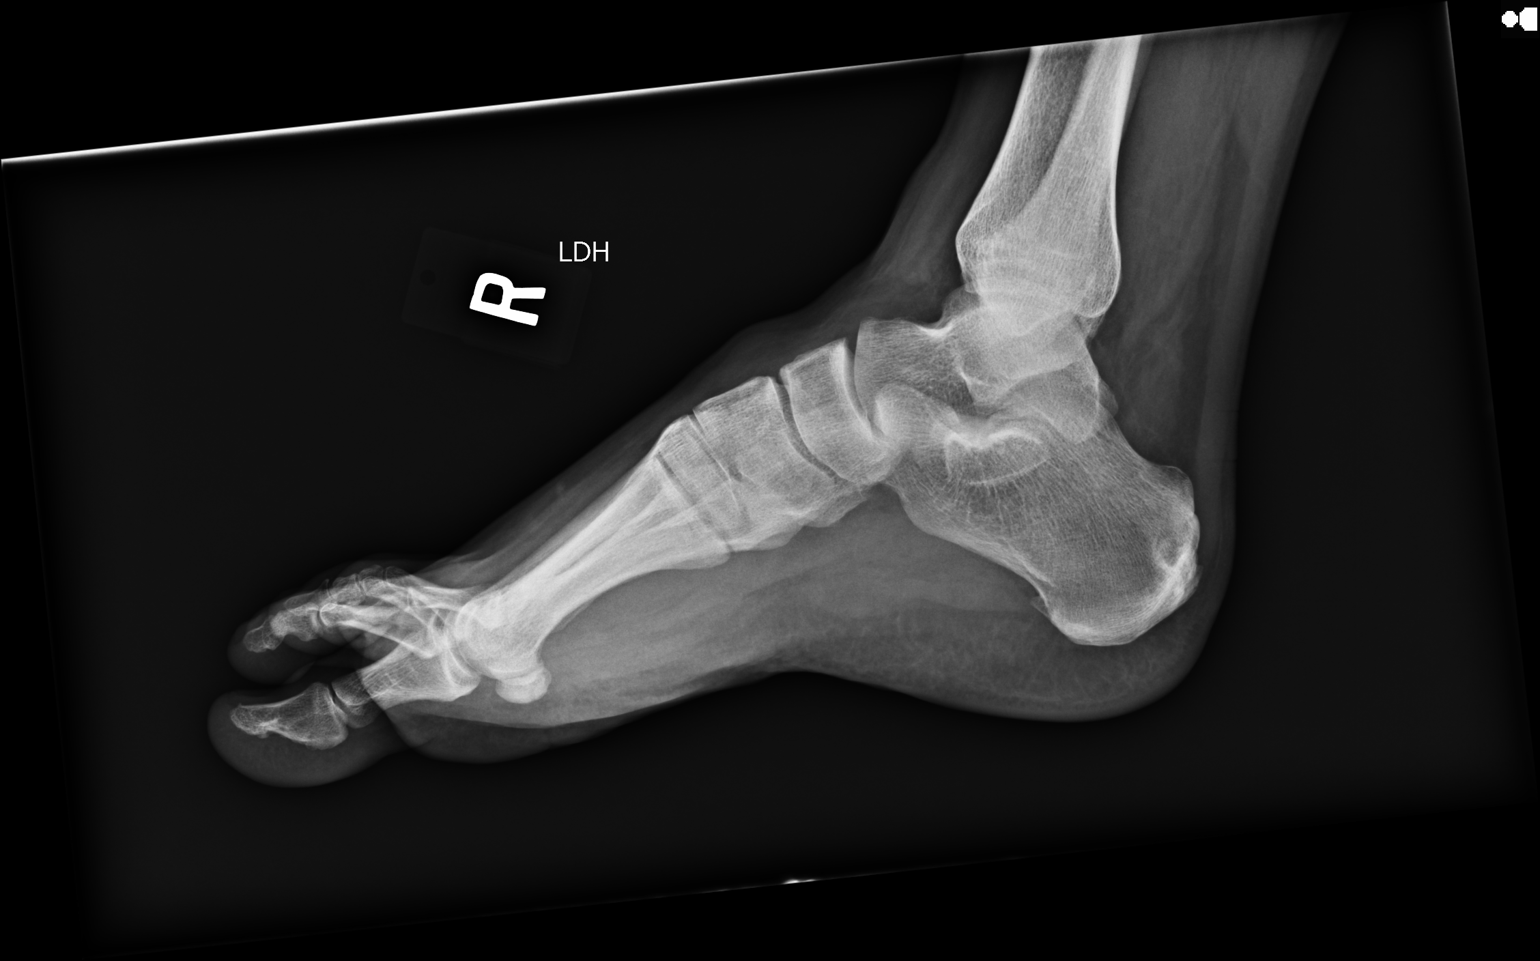
[im 3/3]
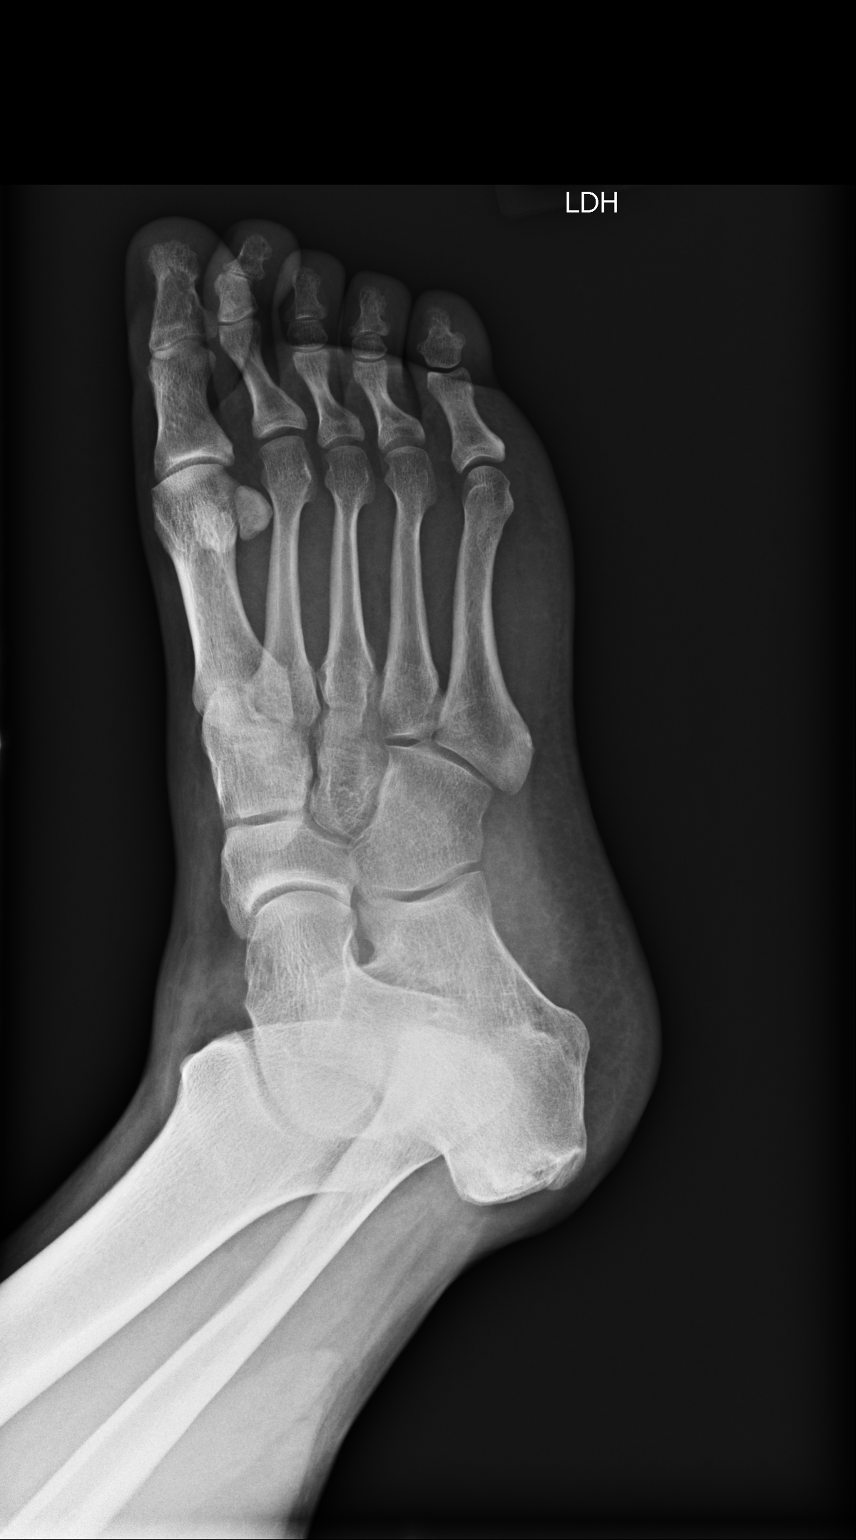

[3 of 3 positions shown; findings below may reference images not displayed]

FINDINGS: No fracture. Metatarsals appear within normal limits. Exaggerated
arch due to nonweightbearing lateral view.

There is an apparent erosion in the calcaneal tuberosity which is
new compared to the prior exam. This can be associated with pre
Achilles bursitis although it can be seen and other entities
including insertional Achilles tendinopathy. A followup MRI should
be considered. If inflammatory arthropathy is a consideration, the
MRI should be performed with and without contrast.
IMPRESSION: No acute osseous abnormality. Small erosion in the calcaneal
tuberosity most commonly associated with pre Achilles bursitis and
inflammatory arthropathy. See discussion above.

## 2015-07-26 DIAGNOSIS — S86012A Strain of left Achilles tendon, initial encounter: Secondary | ICD-10-CM

## 2015-07-26 HISTORY — DX: Strain of left Achilles tendon, initial encounter: S86.012A

## 2015-08-15 ENCOUNTER — Encounter (HOSPITAL_BASED_OUTPATIENT_CLINIC_OR_DEPARTMENT_OTHER): Payer: Self-pay | Admitting: *Deleted

## 2015-08-22 ENCOUNTER — Other Ambulatory Visit: Payer: Self-pay | Admitting: Orthopedic Surgery

## 2015-08-24 ENCOUNTER — Encounter (HOSPITAL_BASED_OUTPATIENT_CLINIC_OR_DEPARTMENT_OTHER): Payer: Self-pay | Admitting: Anesthesiology

## 2015-08-24 ENCOUNTER — Ambulatory Visit (HOSPITAL_BASED_OUTPATIENT_CLINIC_OR_DEPARTMENT_OTHER): Payer: Managed Care, Other (non HMO) | Admitting: Anesthesiology

## 2015-08-24 ENCOUNTER — Encounter (HOSPITAL_BASED_OUTPATIENT_CLINIC_OR_DEPARTMENT_OTHER)
Admission: RE | Disposition: A | Discharge status: Home / Self Care | Payer: Self-pay | Source: Ambulatory Visit | Attending: Orthopedic Surgery

## 2015-08-24 ENCOUNTER — Ambulatory Visit (HOSPITAL_BASED_OUTPATIENT_CLINIC_OR_DEPARTMENT_OTHER)
Admission: RE | Admit: 2015-08-24 | Discharge: 2015-08-24 | Disposition: A | Discharge status: Home / Self Care | Payer: Managed Care, Other (non HMO) | Source: Ambulatory Visit | Attending: Orthopedic Surgery | Admitting: Orthopedic Surgery

## 2015-08-24 DIAGNOSIS — M7662 Achilles tendinitis, left leg: Secondary | ICD-10-CM | POA: Diagnosis not present

## 2015-08-24 DIAGNOSIS — M9262 Juvenile osteochondrosis of tarsus, left ankle: Secondary | ICD-10-CM | POA: Insufficient documentation

## 2015-08-24 HISTORY — PX: ACHILLES TENDON SURGERY: SHX542

## 2015-08-24 HISTORY — DX: Psoriasis, unspecified: L40.9

## 2015-08-24 HISTORY — DX: Strain of left Achilles tendon, initial encounter: S86.012A

## 2015-08-24 SURGERY — REPAIR, TENDON, ACHILLES
Anesthesia: General | Site: Foot | Laterality: Left

## 2015-08-24 MED ORDER — CEFAZOLIN SODIUM 1-5 GM-% IV SOLN
1.0000 g | Freq: Once | INTRAVENOUS | Status: AC
  Administered 2015-08-24: 1 g via INTRAVENOUS

## 2015-08-24 MED ORDER — PROMETHAZINE HCL 25 MG/ML IJ SOLN: 6.2500 mg | INTRAMUSCULAR | Status: DC | PRN

## 2015-08-24 MED ORDER — OXYCODONE-ACETAMINOPHEN 5-325 MG PO TABS: 1.0000 | ORAL_TABLET | Freq: Four times a day (QID) | ORAL | Status: DC | PRN

## 2015-08-24 MED ORDER — PROPOFOL 10 MG/ML IV BOLUS
INTRAVENOUS | Status: AC
Start: 2015-08-24 — End: 2015-08-24
  Filled 2015-08-24: qty 20

## 2015-08-24 MED ORDER — MIDAZOLAM HCL 2 MG/2ML IJ SOLN
INTRAMUSCULAR | Status: AC
  Filled 2015-08-24: qty 2

## 2015-08-24 MED ORDER — FENTANYL CITRATE (PF) 100 MCG/2ML IJ SOLN
INTRAMUSCULAR | Status: AC
  Filled 2015-08-24: qty 2

## 2015-08-24 MED ORDER — DEXAMETHASONE SODIUM PHOSPHATE 10 MG/ML IJ SOLN
INTRAMUSCULAR | Status: AC
  Filled 2015-08-24: qty 1

## 2015-08-24 MED ORDER — HYDROMORPHONE HCL 1 MG/ML IJ SOLN
INTRAMUSCULAR | Status: AC
  Filled 2015-08-24: qty 1

## 2015-08-24 MED ORDER — LIDOCAINE-EPINEPHRINE (PF) 1.5 %-1:200000 IJ SOLN
INTRAMUSCULAR | Status: DC | PRN
  Administered 2015-08-24: 15 mL via PERINEURAL

## 2015-08-24 MED ORDER — ONDANSETRON HCL 4 MG/2ML IJ SOLN
INTRAMUSCULAR | Status: AC
  Filled 2015-08-24: qty 2

## 2015-08-24 MED ORDER — LIDOCAINE HCL (CARDIAC) 20 MG/ML IV SOLN
INTRAVENOUS | Status: AC
Start: 2015-08-24 — End: 2015-08-24
  Filled 2015-08-24: qty 5

## 2015-08-24 MED ORDER — PROPOFOL 10 MG/ML IV BOLUS
INTRAVENOUS | Status: DC | PRN
  Administered 2015-08-24: 270 mg via INTRAVENOUS

## 2015-08-24 MED ORDER — MIDAZOLAM HCL 2 MG/2ML IJ SOLN
1.0000 mg | INTRAMUSCULAR | Status: DC | PRN
  Administered 2015-08-24: 2 mg via INTRAVENOUS

## 2015-08-24 MED ORDER — FENTANYL CITRATE (PF) 100 MCG/2ML IJ SOLN
50.0000 ug | INTRAMUSCULAR | Status: AC | PRN
  Administered 2015-08-24 (×2): 25 ug via INTRAVENOUS
  Administered 2015-08-24: 100 ug via INTRAVENOUS
  Administered 2015-08-24 (×2): 25 ug via INTRAVENOUS
  Administered 2015-08-24: 100 ug via INTRAVENOUS

## 2015-08-24 MED ORDER — CEFAZOLIN SODIUM-DEXTROSE 2-3 GM-% IV SOLR
INTRAVENOUS | Status: AC
  Filled 2015-08-24: qty 50

## 2015-08-24 MED ORDER — CEFAZOLIN SODIUM 1-5 GM-% IV SOLN
INTRAVENOUS | Status: AC
  Filled 2015-08-24: qty 50

## 2015-08-24 MED ORDER — LIDOCAINE HCL (CARDIAC) 20 MG/ML IV SOLN
INTRAVENOUS | Status: DC | PRN
  Administered 2015-08-24: 60 mg via INTRAVENOUS

## 2015-08-24 MED ORDER — LACTATED RINGERS IV SOLN
INTRAVENOUS | Status: DC
  Administered 2015-08-24 (×2): via INTRAVENOUS

## 2015-08-24 MED ORDER — ONDANSETRON HCL 4 MG/2ML IJ SOLN
INTRAMUSCULAR | Status: DC | PRN
  Administered 2015-08-24: 4 mg via INTRAVENOUS

## 2015-08-24 MED ORDER — LIDOCAINE HCL (CARDIAC) 20 MG/ML IV SOLN
INTRAVENOUS | Status: AC
  Filled 2015-08-24: qty 5

## 2015-08-24 MED ORDER — BUPIVACAINE HCL (PF) 0.5 % IJ SOLN
INTRAMUSCULAR | Status: AC
  Filled 2015-08-24: qty 30

## 2015-08-24 MED ORDER — HYDROMORPHONE HCL 1 MG/ML IJ SOLN
0.2500 mg | INTRAMUSCULAR | Status: DC | PRN
Start: 2015-08-24 — End: 2015-08-24
  Administered 2015-08-24: 0.5 mg via INTRAVENOUS

## 2015-08-24 MED ORDER — ASPIRIN EC 325 MG PO TBEC: 325.0000 mg | DELAYED_RELEASE_TABLET | Freq: Every day | ORAL | Status: DC

## 2015-08-24 MED ORDER — CEFAZOLIN SODIUM-DEXTROSE 2-3 GM-% IV SOLR
2.0000 g | INTRAVENOUS | Status: AC
  Administered 2015-08-24: 2 g via INTRAVENOUS

## 2015-08-24 MED ORDER — BUPIVACAINE HCL (PF) 0.5 % IJ SOLN
INTRAMUSCULAR | Status: DC | PRN
  Administered 2015-08-24: 25 mL via PERINEURAL

## 2015-08-24 MED ORDER — DEXAMETHASONE SODIUM PHOSPHATE 4 MG/ML IJ SOLN
INTRAMUSCULAR | Status: DC | PRN
  Administered 2015-08-24: 10 mg via INTRAVENOUS

## 2015-08-24 MED ORDER — SUCCINYLCHOLINE 20MG/ML (10ML) SYRINGE FOR MEDFUSION PUMP - OPTIME
INTRAMUSCULAR | Status: DC | PRN
  Administered 2015-08-24: 140 mg via INTRAVENOUS

## 2015-08-24 MED ORDER — KETOROLAC TROMETHAMINE 30 MG/ML IJ SOLN: 30.0000 mg | Freq: Once | INTRAMUSCULAR | Status: DC | PRN

## 2015-08-24 MED ORDER — CHLORHEXIDINE GLUCONATE 4 % EX LIQD: 60.0000 mL | Freq: Once | CUTANEOUS | Status: DC

## 2015-08-24 MED ORDER — GLYCOPYRROLATE 0.2 MG/ML IJ SOLN: 0.2000 mg | Freq: Once | INTRAMUSCULAR | Status: DC | PRN

## 2015-08-24 MED ORDER — FENTANYL CITRATE (PF) 100 MCG/2ML IJ SOLN
INTRAMUSCULAR | Status: AC
  Filled 2015-08-24: qty 4

## 2015-08-24 MED ORDER — SCOPOLAMINE 1 MG/3DAYS TD PT72: 1.0000 | MEDICATED_PATCH | Freq: Once | TRANSDERMAL | Status: DC | PRN

## 2015-08-24 SURGICAL SUPPLY — 65 items
BANDAGE ELASTIC 4 VELCRO ST LF (GAUZE/BANDAGES/DRESSINGS) ×2 IMPLANT
BANDAGE ELASTIC 6 VELCRO ST LF (GAUZE/BANDAGES/DRESSINGS) ×2 IMPLANT
BANDAGE ESMARK 6X9 LF (GAUZE/BANDAGES/DRESSINGS) IMPLANT
BLADE SURG 10 STRL SS (BLADE) ×2 IMPLANT
BLADE SURG 15 STRL LF DISP TIS (BLADE) ×1 IMPLANT
BLADE SURG 15 STRL SS (BLADE) ×2
BNDG CMPR 9X4 STRL LF SNTH (GAUZE/BANDAGES/DRESSINGS)
BNDG CMPR 9X6 STRL LF SNTH (GAUZE/BANDAGES/DRESSINGS) ×1
BNDG ESMARK 4X9 LF (GAUZE/BANDAGES/DRESSINGS) IMPLANT
BNDG ESMARK 6X9 LF (GAUZE/BANDAGES/DRESSINGS) ×2
COVER BACK TABLE 60X90IN (DRAPES) ×2 IMPLANT
DECANTER SPIKE VIAL GLASS SM (MISCELLANEOUS) IMPLANT
DRAPE EXTREMITY T 121X128X90 (DRAPE) ×2 IMPLANT
DRAPE U 20/CS (DRAPES) ×2 IMPLANT
DRAPE U-SHAPE 47X51 STRL (DRAPES) ×2 IMPLANT
DURAPREP 26ML APPLICATOR (WOUND CARE) ×2 IMPLANT
ELECT REM PT RETURN 9FT ADLT (ELECTROSURGICAL) ×2
ELECTRODE REM PT RTRN 9FT ADLT (ELECTROSURGICAL) ×1 IMPLANT
GAUZE SPONGE 4X4 12PLY STRL (GAUZE/BANDAGES/DRESSINGS) ×2 IMPLANT
GAUZE XEROFORM 1X8 LF (GAUZE/BANDAGES/DRESSINGS) ×2 IMPLANT
GLOVE BIOGEL PI IND STRL 8 (GLOVE) ×2 IMPLANT
GLOVE BIOGEL PI INDICATOR 8 (GLOVE) ×2
GLOVE ECLIPSE 7.5 STRL STRAW (GLOVE) ×4 IMPLANT
GLOVE SURG SS PI 7.0 STRL IVOR (GLOVE) ×2 IMPLANT
GOWN STRL REUS W/ TWL LRG LVL3 (GOWN DISPOSABLE) ×1 IMPLANT
GOWN STRL REUS W/ TWL XL LVL3 (GOWN DISPOSABLE) ×1 IMPLANT
GOWN STRL REUS W/TWL LRG LVL3 (GOWN DISPOSABLE) ×2
GOWN STRL REUS W/TWL XL LVL3 (GOWN DISPOSABLE) ×4 IMPLANT
IMPL SYS BIOCOMP ACH SPEED (Anchor) IMPLANT
IMPLANT SYS BIOCOMP ACH SPEED (Anchor) ×2 IMPLANT
NDL HYPO 25X1 1.5 SAFETY (NEEDLE) IMPLANT
NDL SUT 6 .5 CRC .975X.05 MAYO (NEEDLE) IMPLANT
NEEDLE HYPO 25X1 1.5 SAFETY (NEEDLE) IMPLANT
NEEDLE MAYO TAPER (NEEDLE)
NS IRRIG 1000ML POUR BTL (IV SOLUTION) ×2 IMPLANT
PAD CAST 4YDX4 CTTN HI CHSV (CAST SUPPLIES) ×2 IMPLANT
PADDING CAST ABS 4INX4YD NS (CAST SUPPLIES) ×1
PADDING CAST ABS COTTON 4X4 ST (CAST SUPPLIES) ×1 IMPLANT
PADDING CAST COTTON 4X4 STRL (CAST SUPPLIES) ×4
PASSER SUT SWANSON 36MM LOOP (INSTRUMENTS) IMPLANT
PENCIL BUTTON HOLSTER BLD 10FT (ELECTRODE) ×2 IMPLANT
SPLINT FAST PLASTER 5X30 (CAST SUPPLIES) ×2
SPLINT FIBERGLASS 4X30 (CAST SUPPLIES) ×1 IMPLANT
SPLINT PLASTER CAST FAST 5X30 (CAST SUPPLIES) ×2 IMPLANT
SPONGE LAP 4X18 X RAY DECT (DISPOSABLE) ×2 IMPLANT
STOCKINETTE 6  STRL (DRAPES) ×1
STOCKINETTE 6 STRL (DRAPES) ×1 IMPLANT
SUCTION FRAZIER TIP 10 FR DISP (SUCTIONS) ×1 IMPLANT
SUT ETHIBOND 0 MO6 C/R (SUTURE) IMPLANT
SUT ETHIBOND 2 OS 4 DA (SUTURE) ×8 IMPLANT
SUT ETHILON 3 0 PS 1 (SUTURE) ×2 IMPLANT
SUT FIBERWIRE #2 38 T-5 BLUE (SUTURE)
SUT FIBERWIRE #5 38 CONV NDL (SUTURE)
SUT VIC AB 2-0 CT1 27 (SUTURE) ×2
SUT VIC AB 2-0 CT1 TAPERPNT 27 (SUTURE) ×1 IMPLANT
SUT VIC AB 3-0 FS2 27 (SUTURE) ×1 IMPLANT
SUTURE FIBERWR #2 38 T-5 BLUE (SUTURE) IMPLANT
SUTURE FIBERWR #5 38 CONV NDL (SUTURE) ×3 IMPLANT
SYR BULB 3OZ (MISCELLANEOUS) ×2 IMPLANT
SYR CONTROL 10ML LL (SYRINGE) IMPLANT
TOWEL OR 17X24 6PK STRL BLUE (TOWEL DISPOSABLE) ×6 IMPLANT
TOWEL OR NON WOVEN STRL DISP B (DISPOSABLE) ×2 IMPLANT
TUBE CONNECTING 20X1/4 (TUBING) IMPLANT
UNDERPAD 30X30 (UNDERPADS AND DIAPERS) ×2 IMPLANT
YANKAUER SUCT BULB TIP NO VENT (SUCTIONS) ×1 IMPLANT

## 2015-08-24 NOTE — Brief Op Note (Signed)
08/24/2015  4:37 PM  PATIENT:  Frank DuhamelStephen B Wall  34 y.o. male  PRE-OPERATIVE DIAGNOSIS:  Left achilles partial tear  POST-OPERATIVE DIAGNOSIS:  Left achilles partial tear  PROCEDURE:  Procedure(s) with comments: ACHILLES TENDON REPAIR (Left) - Achilles  SURGEON:  Surgeon(s) and Role:    * Jodi GeraldsJohn Caira Poche, MD - Primary  PHYSICIAN ASSISTANT:   ASSISTANTS: bethune   ANESTHESIA:   general  EBL:  Total I/O In: 2144 [P.O.:444; I.V.:1700] Out: -   BLOOD ADMINISTERED:none  DRAINS: none   LOCAL MEDICATIONS USED:  none     SPECIMEN:  No Specimen  DISPOSITION OF SPECIMEN:  N/A  COUNTS:  YES  TOURNIQUET:   Total Tourniquet Time Documented: Thigh (laterality) - 60 minutes Total: Thigh (laterality) - 60 minutes   DICTATION: .Other Dictation: Dictation Number [id number]  PLAN OF CARE: Discharge to home after PACU  PATIENT DISPOSITION:  PACU - hemodynamically stable.   Delay start of Pharmacological VTE agent (>24hrs) due to surgical blood loss or risk of bleeding: no

## 2015-08-24 NOTE — Transfer of Care (Signed)
Immediate Anesthesia Transfer of Care Note  Patient: Frank DuhamelStephen B Considine  Procedure(s) Performed: Procedure(s) with comments: ACHILLES TENDON REPAIR (Left) - Achilles  Patient Location: PACU  Anesthesia Type:GA combined with regional for post-op pain  Level of Consciousness: awake, oriented and patient cooperative  Airway & Oxygen Therapy: Patient Spontanous Breathing and Patient connected to face mask oxygen  Post-op Assessment: Report given to RN and Post -op Vital signs reviewed and stable  Post vital signs: Reviewed and stable  Last Vitals:  Filed Vitals:   08/24/15 1230 08/24/15 1235  BP: 133/95 131/92  Pulse: 82 88  Temp:    Resp: 14 17    Complications: No apparent anesthesia complications

## 2015-08-24 NOTE — Anesthesia Preprocedure Evaluation (Addendum)
Anesthesia Evaluation  Patient identified by MRN, date of birth, ID band Patient awake    Reviewed: Allergy & Precautions, NPO status , Patient's Chart, lab work & pertinent test results  Airway Mallampati: II  TM Distance: >3 FB Neck ROM: Full    Dental no notable dental hx. (+) Teeth Intact, Dental Advisory Given   Pulmonary neg pulmonary ROS,    Pulmonary exam normal breath sounds clear to auscultation       Cardiovascular negative cardio ROS Normal cardiovascular exam Rhythm:Regular Rate:Normal     Neuro/Psych negative neurological ROS  negative psych ROS   GI/Hepatic negative GI ROS, Neg liver ROS,   Endo/Other  negative endocrine ROS  Renal/GU negative Renal ROS  negative genitourinary   Musculoskeletal negative musculoskeletal ROS (+)   Abdominal   Peds negative pediatric ROS (+)  Hematology negative hematology ROS (+)   Anesthesia Other Findings   Reproductive/Obstetrics negative OB ROS                            Anesthesia Physical Anesthesia Plan  ASA: I  Anesthesia Plan: General   Post-op Pain Management: GA combined w/ Regional for post-op pain   Induction: Intravenous  Airway Management Planned: LMA  Additional Equipment:   Intra-op Plan:   Post-operative Plan: Extubation in OR  Informed Consent: I have reviewed the patients History and Physical, chart, labs and discussed the procedure including the risks, benefits and alternatives for the proposed anesthesia with the patient or authorized representative who has indicated his/her understanding and acceptance.   Dental advisory given  Plan Discussed with: CRNA and Surgeon  Anesthesia Plan Comments:         Anesthesia Quick Evaluation

## 2015-08-24 NOTE — Anesthesia Postprocedure Evaluation (Signed)
Anesthesia Post Note  Patient: Frank DuhamelStephen B Wall  Procedure(s) Performed: Procedure(s) (LRB): ACHILLES TENDON REPAIR (Left)  Patient location during evaluation: PACU Anesthesia Type: General and Regional Level of consciousness: awake and alert Pain management: pain level controlled Vital Signs Assessment: post-procedure vital signs reviewed and stable Respiratory status: spontaneous breathing, nonlabored ventilation, respiratory function stable and patient connected to nasal cannula oxygen Cardiovascular status: blood pressure returned to baseline and stable Postop Assessment: no signs of nausea or vomiting Anesthetic complications: no    Last Vitals:  Filed Vitals:   08/24/15 1428 08/24/15 1430  BP:  148/67  Pulse: 85 83  Temp:    Resp: 13 14    Last Pain:  Filed Vitals:   08/24/15 1445  PainSc: 5                  [name]

## 2015-08-24 NOTE — Anesthesia Procedure Notes (Addendum)
Anesthesia Regional Block:  Popliteal block  Pre-Anesthetic Checklist: ,, timeout performed, Correct Patient, Correct Site, Correct Laterality, Correct Procedure, Correct Position, site marked, Risks and benefits discussed,  Surgical consent,  Pre-op evaluation,  At surgeon's request and post-op pain management  Laterality: Left  Prep: chloraprep       Needles:  Injection technique: Single-shot  Needle Type: Echogenic Needle     Needle Length: 9cm 9 cm Needle Gauge: 22 and 22 G    Additional Needles:  Procedures: ultrasound guided (picture in chart) Popliteal block Narrative:  Injection made incrementally with aspirations every 5 mL.  Performed by: Personally   Additional Notes: Patient tolerated the procedure well without complications   Procedure Name: Intubation Date/Time: 08/24/2015 12:45 PM Performed by: Tyrone NineSAUVE, Pritika Alvarez F Pre-anesthesia Checklist: Patient identified, Timeout performed, Emergency Drugs available, Suction available and Patient being monitored Patient Re-evaluated:Patient Re-evaluated prior to inductionOxygen Delivery Method: Circle system utilized Preoxygenation: Pre-oxygenation with 100% oxygen Intubation Type: IV induction Ventilation: Mask ventilation without difficulty and Oral airway inserted - appropriate to patient size Laryngoscope Size: Mac and 3 Grade View: Grade III Tube type: Oral Tube size: 8.0 mm Number of attempts: 1 Airway Equipment and Method: Stylet Placement Confirmation: ETT inserted through vocal cords under direct vision,  positive ETCO2 and breath sounds checked- equal and bilateral Secured at: 22 cm Tube secured with: Tape Dental Injury: Teeth and Oropharynx as per pre-operative assessment  Difficulty Due To: Difficulty was anticipated Future Recommendations: Recommend- induction with short-acting agent, and alternative techniques readily available

## 2015-08-24 NOTE — H&P (Signed)
  PREOPERATIVE H&P  Chief Complaint: left heel pain and questionable Achilles tendon partial tear.  HPI: Frank DuhamelStephen B Bartunek is a 34 y.o. male who presents for evaluation of left heel pain and partial Achilles tendon tear.. It has been present for many months and has been worsening. He has failed conservative measuresincluding activity modification, anti-inflammatory medication, immobilization, and physical therapy.  The patient has had an ultrasound showing partial tearing of the Achilles tendon at its insertion.. Pain is rated as moderate.  Past Medical History  Diagnosis Date  . Psoriasis   . Partial tear of left Achilles tendon 07/2015   Past Surgical History  Procedure Laterality Date  . Cyst excision      neck  . Foot fracture surgery    . Nasal septum surgery     Social History   Social History  . Marital Status: Single    Spouse Name: N/A  . Number of Children: N/A  . Years of Education: N/A   Social History Main Topics  . Smoking status: Never Smoker   . Smokeless tobacco: Never Used  . Alcohol Use: Yes     Comment: 1-2 x/week  . Drug Use: No  . Sexual Activity: Not Asked   Other Topics Concern  . None   Social History Narrative   Family History  Problem Relation Age of Onset  . Diabetes Father    No Known Allergies Prior to Admission medications   Medication Sig Start Date End Date Taking? Authorizing Provider  HYDROcodone-acetaminophen (NORCO/VICODIN) 5-325 MG tablet Take 1 tablet by mouth every 6 (six) hours as needed for moderate pain.   Yes Historical Provider, MD  traMADol (ULTRAM) 50 MG tablet Take 1 tablet (50 mg total) by mouth every 6 (six) hours as needed. 06/01/14  Yes Abigail Harris, PA-C     Positive ROS: none  All other systems have been reviewed and were otherwise negative with the exception of those mentioned in the HPI and as above.  Physical Exam: There were no vitals filed for this visit.  General: Alert, no acute  distress Cardiovascular: No pedal edema Respiratory: No cyanosis, no use of accessory musculature GI: No organomegaly, abdomen is soft and non-tender Skin: No lesions in the area of chief complaint Neurologic: Sensation intact distally Psychiatric: Patient is competent for consent with normal mood and affect Lymphatic: No axillary or cervical lymphadenopathy  MUSCULOSKELETAL: left heel: Painful range of motion.  Dramatic turns to palpation over the Achilles tendon insertion area and  Assessment/Plan: Left achilles partial tear Plan for Procedure(s): ACHILLES TENDON REPAIR through a dorsal approach with a suture bridge  The risks benefits and alternatives were discussed with the patient including but not limited to the risks of nonoperative treatment, versus surgical intervention including infection, bleeding, nerve injury, malunion, nonunion, hardware prominence, hardware failure, need for hardware removal, blood clots, cardiopulmonary complications, morbidity, mortality, among others, and they were willing to proceed.  Predicted outcome is good, although there will be at least a six to nine month expected recovery.  Analiz Tvedt L, MD 08/24/2015 2:35 AM

## 2015-08-24 NOTE — Progress Notes (Signed)
Assisted Dr. Rose with left, ultrasound guided, popliteal block. Side rails up, monitors on throughout procedure. See vital signs in flow sheet. Tolerated Procedure well. 

## 2015-08-24 NOTE — Discharge Instructions (Signed)
Elevate your left leg as much as possible above your heart. Ambulate nonweightbearing on the left with crutches. Apply ice around your ankle for a couple of days.   Regional Anesthesia Blocks  1. Numbness or the inability to move the "blocked" extremity may last from 3-48 hours after placement. The length of time depends on the medication injected and your individual response to the medication. If the numbness is not going away after 48 hours, call your surgeon.  2. The extremity that is blocked will need to be protected until the numbness is gone and the  Strength has returned. Because you cannot feel it, you will need to take extra care to avoid injury. Because it may be weak, you may have difficulty moving it or using it. You may not know what position it is in without looking at it while the block is in effect.  3. For blocks in the legs and feet, returning to weight bearing and walking needs to be done carefully. You will need to wait until the numbness is entirely gone and the strength has returned. You should be able to move your leg and foot normally before you try and bear weight or walk. You will need someone to be with you when you first try to ensure you do not fall and possibly risk injury.  4. Bruising and tenderness at the needle site are common side effects and will resolve in a few days.  5. Persistent numbness or new problems with movement should be communicated to the surgeon or the [facility] [phone])/ [facility] [phone]).     Post Anesthesia Home Care Instructions  Activity: Get plenty of rest for the remainder of the day. A responsible adult should stay with you for 24 hours following the procedure.  For the next 24 hours, DO NOT: -Drive a car -Advertising copywriterperate machinery -Drink alcoholic beverages -Take any medication unless instructed by your physician -Make any legal decisions or sign important papers.  Meals: Start with liquid  foods such as gelatin or soup. Progress to regular foods as tolerated. Avoid greasy, spicy, heavy foods. If nausea and/or vomiting occur, drink only clear liquids until the nausea and/or vomiting subsides. Call your physician if vomiting continues.  Special Instructions/Symptoms: Your throat may feel dry or sore from the anesthesia or the breathing tube placed in your throat during surgery. If this causes discomfort, gargle with warm salt water. The discomfort should disappear within 24 hours.  If you had a scopolamine patch placed behind your ear for the management of post- operative nausea and/or vomiting:  1. The medication in the patch is effective for 72 hours, after which it should be removed.  Wrap patch in a tissue and discard in the trash. Wash hands thoroughly with soap and water. 2. You may remove the patch earlier than 72 hours if you experience unpleasant side effects which may include dry mouth, dizziness or visual disturbances. 3. Avoid touching the patch. Wash your hands with soap and water after contact with the patch.

## 2015-08-25 ENCOUNTER — Encounter (HOSPITAL_BASED_OUTPATIENT_CLINIC_OR_DEPARTMENT_OTHER): Payer: Self-pay | Admitting: Orthopedic Surgery

## 2015-08-25 NOTE — Addendum Note (Signed)
Addendum  created 08/25/15 [id number] by Lance CoonWesley Renne Cornick, CRNA   Modules edited: Charges VN

## 2015-08-26 NOTE — Op Note (Signed)
NAMEMEHTAAB, MAYEDA              ACCOUNT NO.:  [id number]  MEDICAL RECORD NO.:  [id number]  LOCATION:                               FACILITY:  [facility]  PHYSICIAN:  Harvie Junior, M.D.   DATE OF BIRTH:  [birth year]  DATE OF PROCEDURE:  08/24/2015 DATE OF DISCHARGE:  08/24/2015                              OPERATIVE REPORT   PREOPERATIVE DIAGNOSIS:  Severe Achilles tendinitis with calcified Achilles tendinitis with a prominent Haglund's deformity.  PROCEDURE: 1. Partial ostectomy of the calcaneus with a saw and rongeur. 2. Achilles tendon repair, left Achilles.  SURGEON:  Harvie Junior, MD  ASSISTANT:  Marshia Ly, PA  ANESTHESIA:  General.  BRIEF HISTORY:  Mr. Ortega is a 34 year old male with history of significant complaints of Achilles tendinitis on the left side, had been treated conservatively for prolonged period of time.  Ultrasound was obtained and showed that it had high-grade partial tendon tearing at the insertion after failure of conservative care and ultrasound showing concern for tear.  We had discussions of treatment options we told and we could undergo continued prolonged conservative care, but at that point, the patient felt that he would be better served with operative intervention and he was taken to the operating room for this procedure. Given his large size and young age, and significant calcified Achilles tendon portion, an ultrasound showing concern for high-grade partial tear, I felt that we really did need to take down the Achilles tendon and this was chosen to be done preoperatively, he was brought to the operating room for this procedure.  PROCEDURE IN DETAIL:  The patient was brought to the operating room. After adequate anesthesia was obtained with general anesthetic, patient was prone on the operating table.  The left leg was prepped and draped in usual sterile fashion.  Following this, a medial incision was made curving around the heel.   Full-thickness flaps were raised on both sides and the Achilles tendon was identified, it was taken down posteriorly. The calcified portion was removed.  The Haglund's was confirmed, it was taken down with a saw.  At this point, the SpeedBridge was opened and 4 holes were drilled 15 mm apart and 15 mm side-by-side at the appropriate location where the tendon would attach to this now recently sought and verified bed of bleeding bone.  Once these were done, all 4 holes were tapped.  The Achilles tendon was then held in place.  The SutureBridge sutures were then put in place and following that, the sutures that came additional and the proximal anchors were then sewed.  The tendon was sewed right down to this area and then the SutureBridge was used to hold this tendon against this cancellous bony bed which had just been made.  Once this was done, the wound was irrigated, suctioned dry and the wound was closed in layers.  Sterile compressive dressing was applied.  The patient was taken to the recovery room, when he was noted to be in satisfactory condition.  He was placed into a U and a posterior splint with the foot in gravity equinus.     Harvie Junior, M.D.     Ranae Plumber  D:  08/24/2015  T:  08/25/2015  Job:  [id number]

## 2015-09-25 DIAGNOSIS — S86892S Other injury of other muscle(s) and tendon(s) at lower leg level, left leg, sequela: Secondary | ICD-10-CM

## 2015-09-25 HISTORY — DX: Other injury of other muscle(s) and tendon(s) at lower leg level, left leg, sequela: S86.892S

## 2015-09-25 HISTORY — PX: KNEE CARTILAGE SURGERY: SHX688

## 2015-09-25 HISTORY — PX: PATELLAR TENDON REPAIR: SHX737

## 2017-01-31 DIAGNOSIS — M76829 Posterior tibial tendinitis, unspecified leg: Secondary | ICD-10-CM | POA: Insufficient documentation

## 2017-02-22 ENCOUNTER — Other Ambulatory Visit: Payer: Self-pay | Admitting: Orthopedic Surgery

## 2017-11-07 ENCOUNTER — Telehealth (HOSPITAL_COMMUNITY): Payer: Self-pay | Admitting: Psychology

## 2017-11-12 ENCOUNTER — Ambulatory Visit (INDEPENDENT_AMBULATORY_CARE_PROVIDER_SITE_OTHER): Payer: BC Managed Care – PPO | Admitting: Psychology

## 2017-11-12 ENCOUNTER — Encounter (HOSPITAL_COMMUNITY): Payer: Self-pay | Admitting: Psychology

## 2017-11-12 DIAGNOSIS — F1120 Opioid dependence, uncomplicated: Secondary | ICD-10-CM | POA: Diagnosis not present

## 2017-11-12 DIAGNOSIS — F1020 Alcohol dependence, uncomplicated: Secondary | ICD-10-CM

## 2017-11-12 NOTE — Progress Notes (Signed)
Comprehensive Clinical Assessment (CCA) Note  11/12/2017 Frank Wall [id number]  Visit Diagnosis:   No diagnosis found.    CCA Part One  Part One has been completed on paper by the patient.  (See scanned document in Chart Review)  CCA Part Two A  Intake/Chief Complaint:  CCA Intake With Chief Complaint CCA Part Two Date: 11/12/17 CCA Part Two Time: 1105 Chief Complaint/Presenting Problem: Patient successfully completed residential treatment at [facility] and is seeking step-down level of care. Patients Currently Reported Symptoms/Problems: Sleeplessness Collateral Involvement: Patient signed consent for wife and [facility] Individual's Strengths: Motivated, access to transportation, family support, articulate Individual's Preferences: Daytime program and the ability to live separately and visit wife and daughter in [place] Individual's Abilities: Express self, historian, history of employment Type of Services Patient Feels Are Needed: Intensive Outpatient Program Initial Clinical Notes/Concerns: Counselor wonders if patient is downplaying significance of addiction and marriage, since he is staying in [place] prior to returning home  Mental Health Symptoms Depression:  Depression: N/A  Mania:  Mania: N/A  Anxiety:   Anxiety: Sleep  Psychosis:  Psychosis: N/A  Trauma:  Trauma: N/A  Obsessions:  Obsessions: N/A  Compulsions:  Compulsions: N/A  Inattention:  Inattention: N/A  Hyperactivity/Impulsivity:  Hyperactivity/Impulsivity: N/A  Oppositional/Defiant Behaviors:  Oppositional/Defiant Behaviors: N/A  Borderline Personality:  Emotional Irregularity: N/A  Other Mood/Personality Symptoms:  Other Mood/Personality Symtpoms: Patient denies history of any psychiatric issues and denies taking psychotropic medications   Mental Status Exam Appearance and self-care  Stature:  Stature: Average  Weight:  Weight: Average weight  Clothing:  Clothing: Casual  Grooming:   Grooming: Normal  Cosmetic use:  Cosmetic Use: None  Posture/gait:  Posture/Gait: Normal  Motor activity:  Motor Activity: Not Remarkable  Sensorium  Attention:  Attention: Normal  Concentration:  Concentration: Normal  Orientation:  Orientation: X5  Recall/memory:  Recall/Memory: Normal  Affect and Mood  Affect:  Affect: Appropriate  Mood:     Relating  Eye contact:  Eye Contact: Normal  Facial expression:  Facial Expression: Responsive  Attitude toward examiner:  Attitude Toward Examiner: Cooperative  Thought and Language  Speech flow: Speech Flow: Normal  Thought content:  Thought Content: Appropriate to mood and circumstances  Preoccupation:     Hallucinations:     Organization:     Company secretary of Knowledge:  Fund of Knowledge: Average  Intelligence:  Intelligence: Average  Abstraction:  Abstraction: Normal  Judgement:  Judgement: Normal  Reality Testing:  Reality Testing: Realistic  Insight:  Insight: Good  Decision Making:  Decision Making: Impulsive  Social Functioning  Social Maturity:  Social Maturity: Isolates  Social Judgement:  Social Judgement: Normal  Stress  Stressors:  Stressors: Transitions, Housing, Work  Coping Ability:  Coping Ability: Engineer, agricultural Deficits:     Supports:      Family and Psychosocial History: Family history Marital status: Married Number of Years Married: 4 What types of issues is patient dealing with in the relationship?: Regaining trust, communication Are you sexually active?: Yes What is your sexual orientation?: Heterosexual Has your sexual activity been affected by drugs, alcohol, medication, or emotional stress?: No Does patient have children?: Yes How many children?: 1 How is patient's relationship with their children?: Positive  Childhood History:  Childhood History By whom was/is the patient raised?: Both parents Additional childhood history information: Parents divorced when patient was [age] years  old, he has no memory of them being together Description of patient's relationship  with caregiver when they were a child: Divorced, living in separate cities Patient's description of current relationship with people who raised him/her: Not close with father; close with mother How were you disciplined when you got in trouble as a child/adolescent?: Appropriately Does patient have siblings?: No Did patient suffer any verbal/emotional/physical/sexual abuse as a child?: No Did patient suffer from severe childhood neglect?: No Has patient ever been sexually abused/assaulted/raped as an adolescent or adult?: No Was the patient ever a victim of a crime or a disaster?: No Witnessed domestic violence?: No Has patient been effected by domestic violence as an adult?: No  CCA Part Two B  Employment/Work Situation: Employment / Work Psychologist, occupational Employment situation: Employed Where is patient currently employed?: [employer] How long has patient been employed?: 3 years Patient's job has been impacted by current illness: No What is the longest time patient has a held a job?: 12 years Where was the patient employed at that time?: [employer]- same work Has patient ever been in the [employer]?: No Has patient ever served in combat?: No Did You Receive Any Psychiatric Treatment/Services While in Equities trader?: No Are There Guns or Other Weapons in Your Home?: No  Education: Education Name of [employer] School: Engineer, 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 Did Garment/textile technologist From [employer]?: Yes Did Theme park manager?: Yes What Type of College Degree Do you Have?: BA, Communications Did You Have Any Difficulty At [employer]?: No  Religion: Religion/Spirituality Are You A Religious Person?: No  Leisure/Recreation: Leisure / Recreation Leisure and Hobbies: Patient has physical health issues, which prevent him from being active  Exercise/Diet: Exercise/Diet Do You Exercise?: No Have You Gained or Lost A Significant  Amount of Weight in the Past Six Months?: Yes-Lost Number of Pounds Lost?: 30 Do You Follow a Special Diet?: No Do You Have Any Trouble Sleeping?: Yes Explanation of Sleeping Difficulties: Can't sleep more than about 5 hours  CCA Part Two C  Alcohol/Drug Use: Alcohol / Drug Use Pain Medications: N/A Prescriptions: N/A Over the Counter: N/A History of alcohol / drug use?: Yes Longest period of sobriety (when/how long): 34 days (now) Negative Consequences of Use: Personal relationships, Financial Withdrawal Symptoms: Blackouts, Irritability Substance #1 Name of Substance 1: Opiates 1 - Age of First Use: 16 1 - Amount (size/oz): up to 300 mg 1 - Frequency: daily 1 - Duration: 10 years 1 - Last Use / Amount: 10/09/17, 50 mg Substance #2 Name of Substance 2: Alcohol 2 - Age of First Use: 12 2 - Amount (size/oz): 1 pint to 1/5th of whiskey 2 - Frequency: daily 2 - Duration: 10 years 2 - Last Use / Amount: 10/09/17, pint of whiskey Substance #3 Name of Substance 3: Cocaine 3 - Age of First Use: 17 3 - Amount (size/oz): 2-3 lines 3 - Frequency: 1-2x/month 3 - Duration: a few years in college 3 - Last Use / Amount: 2003, a couple lines                CCA Part Three  ASAM's:  Six Dimensions of Multidimensional Assessment  Dimension 1:  Acute Intoxication and/or Withdrawal Potential:  Dimension 1:  Comments: Patient continues to struggle with sleeplessness due to long-term opiate use  Dimension 2:  Biomedical Conditions and Complications:  Dimension 2:  Comments: Patient has had many surgeries, a lot of knee and leg problems, measurable pain  Dimension 3:  Emotional, Behavioral, or Cognitive Conditions and Complications:  Dimension 3:  Comments: Patient's long history of addiction  indicates impulsiveness.  Patient said "my way or the highway"  Dimension 4:  Readiness to Change:  Dimension 4:  Comments: Patient appears very motivated, with strong intention  Dimension 5:   Relapse, Continued use, or Continued Problem Potential:  Dimension 5:  Comments: Potential for relapse is significant  Dimension 6:  Recovery/Living Environment:  Dimension 6:  Recovery/Living Environment Comments: Patient has moved into an [facility] last Friday and intends to be there for the immediate future   Substance use Disorder (SUD) Substance Use Disorder (SUD)  Checklist Symptoms of Substance Use: Substance(s) often taken in large amounts or over longer times than was intended, Recurrent use that results in a fialure to fulfill major rule obligatinos (work, school, home), Persistent desire or unsuccessful efforts to cut down or control use, Evidence of withdrawal (Comment), Presence of craving or strong urge to use, Repeated use in physically hazardous situations, Evidence of tolerance, Continued use despite persistent or recurrent social, interpersonal problems, caused or exacerbated by use, Continued use despite having a persistent/recurrent physical/psychological problem caused/exacerbated by use  Social Function:  Social Functioning Social Maturity: Isolates Social Judgement: Normal  Stress:  Stress Stressors: Transitions, Housing, Work Coping Ability: Resilient Patient Takes Medications The Way The Doctor Instructed?: Yes Priority Risk: Low Acuity  Risk Assessment- Self-Harm Potential: Risk Assessment For Self-Harm Potential Thoughts of Self-Harm: No current thoughts Method: No plan Availability of Means: No access/NA Additional Comments for Self-Harm Potential: Patient denies any history of self-harm  Risk Assessment -Dangerous to Others Potential: Risk Assessment For Dangerous to Others Potential Method: No Plan Availability of Means: No access or NA Intent: Vague intent or NA Notification Required: No need or identified person Additional Comments for Danger to Others Potential: denies violent behaviors towards others  DSM5 Diagnoses: Patient Active Problem List    Diagnosis Date Noted  . Achilles tendinitis 05/18/2013    Patient Centered Plan: Patient is on the following Treatment Plan(s):  Enter CD-IOP, identify daily recovery plan, secure a sponsor and begin step work.  Recommendations for Services/Supports/Treatments: Recommendations for Services/Supports/Treatments Recommendations For Services/Supports/Treatments: CD-IOP Intensive Chemical Dependency Program  Treatment Plan Summary:    Referrals to Alternative Service(s): Referred to Alternative Service(s):   Place:   Date:   Time:    Referred to Alternative Service(s):   Place:   Date:   Time:    Referred to Alternative Service(s):   Place:   Date:   Time:    Referred to Alternative Service(s):   Place:   Date:   Time:     Charmian Muffnn Davie Sagona

## 2017-11-12 NOTE — Progress Notes (Signed)
The patient is a 37 yo married, white, male referred to the CD-IOP by [facility]. He completed residential treatment there on February 14. He lives in [place], [place] with his wife and 4-month old daughter. Instead of returning to his home and family, the patient has moved into an [address] here in [place]. He explained that he wants to focus his efforts on remaining drug-free in early recovery. He does not feel pressure or time constraints and intends to gradually 'reintegrate' into normal life. When asked about this, the patient reported he had heard many horror stories in [facility] about people jumping right back into their lives and work and quickly relapsing. He reported his wife and mother are supportive of his recovery and agree that building on his stability and coping skills will be very important. The patient reported he had completed a nine-day detox at [facility] with Librium and Subutex. He has been using opiates and alcohol daily for almost ten years. The patient reported had was introduced to pain medication in his late teens. He was a well-recognized athlete in high school, but suffered numerous injuries through the years. He described knee injuries, including tearing his patella tendon four times and his Achilles tendon six times.  He was able to get pain medication after numerous surgeries, doctor shopping and buying opiates on the street. The patient reported he was able to maintain employment, avoid notice by his wife, and close friends for years. He worked at home and was not in the office long enough for them to spot discrepancies in his behavior. He lived in [place] for twelve years and described himself as drinking on weekends, but living a normal life (the opiates had not yet become a daily problem). He ran into a woman he had dated in college and they married approximately five years ago. The patient admitted his wife confronted him about his use about six weeks ago. He had lost almost thirty  pounds and his opiate habit had grown to almost 300 mg a day. The patient admitted he tried to stop or reduce his use, but that only lasted about thirty-six hours after the meds had worn off and he suffered from withdrawal.  The patient reported he had experienced few negative consequences of his use, beyond the drugs not really working for him any longer. He had no legal problems, has maintained an excellent work history and his relationship with wife was only impacted in the last few months when she noticed money disappearing and his isolation. The patient identified a genetic predisposition as coming from his paternal grandfather who was an alcoholic. The grandfather died at a young age. The patient's parents divorced when he was just [age] years old. He described his childhood as 'amazing'. He was an only child and lived with his mother in [place] until high school. He moved back to [place] to live with his father during his high school years. The patient graduated from [employer] in 2003 with a Communications degree. 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 He went into the office occasionally, but worked from home for the most part. The patient admitted this isolation made it easier to use. The patient denied any experience of trauma and that he had been very fortunate compared with many of the stories he had heard while in [facility]. The patient reported he could not really exercise because  of his physical problems. His orthopedic surgeon will not consider a knee replacement until he is 37 yo. He acknowledges that some days can be very difficult with considerable pain, but he dismisses this pain, stating 'it could be much worse". The only problem the patient identified was sleeplessness. He sleeps only 3-5 hours per night. This is likely due to his long-term opiate use. He reported Seroquel and  Trazadone did not help. The patient was pleasant throughout the assessment and agreed to return tomorrow to complete the orientation and enter the CD-IOP. His sobriety date = 10/10/17.

## 2017-11-13 ENCOUNTER — Encounter (HOSPITAL_COMMUNITY): Payer: Self-pay | Admitting: Medical

## 2017-11-13 ENCOUNTER — Other Ambulatory Visit (HOSPITAL_COMMUNITY): Payer: BC Managed Care – PPO | Attending: Psychiatry | Admitting: Psychology

## 2017-11-13 VITALS — BP 135/92 | HR 79 | Ht 73.0 in | Wt 222.4 lb

## 2017-11-13 DIAGNOSIS — M1732 Unilateral post-traumatic osteoarthritis, left knee: Secondary | ICD-10-CM | POA: Diagnosis not present

## 2017-11-13 DIAGNOSIS — S76192D Other specified injury of left quadriceps muscle, fascia and tendon, subsequent encounter: Secondary | ICD-10-CM | POA: Diagnosis not present

## 2017-11-13 DIAGNOSIS — F1120 Opioid dependence, uncomplicated: Secondary | ICD-10-CM | POA: Insufficient documentation

## 2017-11-13 DIAGNOSIS — X58XXXD Exposure to other specified factors, subsequent encounter: Secondary | ICD-10-CM | POA: Insufficient documentation

## 2017-11-13 DIAGNOSIS — M179 Osteoarthritis of knee, unspecified: Secondary | ICD-10-CM | POA: Insufficient documentation

## 2017-11-13 DIAGNOSIS — Z811 Family history of alcohol abuse and dependence: Secondary | ICD-10-CM | POA: Insufficient documentation

## 2017-11-13 DIAGNOSIS — S86892D Other injury of other muscle(s) and tendon(s) at lower leg level, left leg, subsequent encounter: Secondary | ICD-10-CM

## 2017-11-13 DIAGNOSIS — S86892S Other injury of other muscle(s) and tendon(s) at lower leg level, left leg, sequela: Secondary | ICD-10-CM | POA: Insufficient documentation

## 2017-11-13 DIAGNOSIS — G894 Chronic pain syndrome: Secondary | ICD-10-CM | POA: Insufficient documentation

## 2017-11-13 DIAGNOSIS — L409 Psoriasis, unspecified: Secondary | ICD-10-CM | POA: Diagnosis not present

## 2017-11-13 DIAGNOSIS — M1710 Unilateral primary osteoarthritis, unspecified knee: Secondary | ICD-10-CM | POA: Insufficient documentation

## 2017-11-13 DIAGNOSIS — F1020 Alcohol dependence, uncomplicated: Secondary | ICD-10-CM | POA: Diagnosis not present

## 2017-11-13 DIAGNOSIS — Z6372 Alcoholism and drug addiction in family: Secondary | ICD-10-CM | POA: Diagnosis not present

## 2017-11-13 DIAGNOSIS — M1712 Unilateral primary osteoarthritis, left knee: Secondary | ICD-10-CM | POA: Insufficient documentation

## 2017-11-13 DIAGNOSIS — S93409A Sprain of unspecified ligament of unspecified ankle, initial encounter: Secondary | ICD-10-CM | POA: Insufficient documentation

## 2017-11-13 DIAGNOSIS — S93629A Sprain of tarsometatarsal ligament of unspecified foot, initial encounter: Secondary | ICD-10-CM | POA: Insufficient documentation

## 2017-11-13 MED ORDER — BACLOFEN 10 MG PO TABS: 10.0000 mg | ORAL_TABLET | Freq: Three times a day (TID) | ORAL | 1 refills | Status: AC

## 2017-11-13 MED ORDER — MIRTAZAPINE 15 MG PO TABS: ORAL_TABLET | ORAL | 1 refills | Status: AC

## 2017-11-13 MED ORDER — NALTREXONE 380 MG IM SUSR: 380.0000 mg | INTRAMUSCULAR | Status: AC

## 2017-11-13 NOTE — Progress Notes (Addendum)
Psychiatric Initial Adult Assessment   Patient Identification: [facility] MRN:  [id number] Date of Evaluation:  11/13/2017 Referral Source:[facility] [place] Chief Complaint: Focus on developing stable recovery from Opiate and Alcohol dependencies after completing Residential treatment at [facility] Complaint    Alcohol Problem; Addiction Problem; Drug Problem    11/07/2017 Visit Diagnosis:  0 Opioid use disorder, severe, dependence (HCC)  0 Alcohol use disorder, severe, dependence (HCC)  0 Chronic pain syndrome  0 Patellar tendon avulsion, left, subsequent encounter  0 Post-traumatic osteoarthritis of left knee  0 Psoriasis  0 Family history of alcoholism in paternal grandfather  History of Present Illness:  37 y/o WM with hx of Opioid pain prescriptions for multiple surgeries on ruptured Lt patellar tendon starting 10 yrs ago with associated daily liquor consumption of 1 pint. (His Paternal GF died of alcoholism at the age of 81 or 32.)His opiate habituation to Oxycodone escalated to 300 mg/day and included precriptions plus buying off the streets.Pt sought treatment at [facility] 10/10/2017 after being confronted by his wife. PTA he tried to stop on his own but the withdrawal at 36 hours was intolerable.He spent 9 days in Detox before actually being able to enter the treatment program.  Other than his family history,chronic pain (Lt knee needs replacement but surgeon is waiting until he turns 44) and his exposure to cannabis and alcohol at age 42 and to opiates at age 22  he has no other risk factors for addiction:Pt does have Psoriasis treted topically with moderate response. (PTSD,Bipolar,MDD,OCD.Anxiety PTSD,Bipolar,MDD,OCD)  His parents ,whom he sees occasionally, divorced when he was 37 yrs old.He does have chronic insomnia that predates his chemical dependencies.He cites alcohol as a sleep aid. He was tried on Trazodone and Seroquel at [facility]  with out success.He has never had a sleep study.  On discharge 2/14  from [facility] pt elected to remain in [facility] [place] and enter an [facility] to adjust to his life without substances.(One of his parents lives here in [place]) His home in [place] is a trigger because he worked out of it and was able to hide his use coming and going as he needed to support his habits.He received an injection of [facility] [place] on discharge date as well. He elected to do CD IOP here as well and contacted head Counselor [facility] on 2/14 by phone and met with her on 2/19: The patient is a 36 yo married, white, male referred to the [facility] by [facility]. He completed residential treatment there on February 14. [place] wife and 17-monthold daughter. Instead of returning to his home and family, the [place] He explained that he wants to focus his efforts on remaining drug-free in early recovery. He does not feel pressure or time constraints and intends to gradually 'reintegrate' into normal life. When asked about this, the patient reported he had heard many horror stories in [place] people jumping right back into their lives and work and quickly relapsing. He reported his wife and mother are supportive of his recovery and agree that building on his stability and coping skills will be very important. The patient reported he had completed a nine-day detox at [facility] [place]. He has been using opiates and alcohol daily for almost ten years. The patient reported had was introduced to pain medication in his late teens. He was a well-recognized athlete  in high school, but suffered numerous injuries through the years. He described knee injuries, including tearing his patella tendon four times and his Achilles tendon six times.  He was able to get pain medication after numerous surgeries, doctor shopping and buying opiates on the street. The patient  reported he was able to maintain employment, avoid notice by his wife, and close friends for years. He worked at home and was not in the office long enough for them to spot discrepancies in his behavior. He lived in [place] for twelve years and described himself as drinking on weekends, but living a normal life (the opiates had not yet become a daily problem). He ran into a woman he had dated in college and they married approximately five years ago. The patient admitted his wife confronted him about his use about six weeks ago. He had lost almost thirty pounds and his opiate habit had grown to almost 300 mg a day. The patient admitted he tried to stop or reduce his use, but that only lasted about thirty-six hours after the meds had worn off and he suffered from withdrawal.  The patient reported he had experienced few negative consequences of his use, beyond the drugs not really working for him any longer. He had no legal problems, has maintained an excellent work history and his relationship with wife was only impacted in the last few months when she noticed money disappearing and his isolation. The patient identified a genetic predisposition as coming from his paternal grandfather who was an alcoholic. The grandfather died at a young age. The patient's parents divorced when he was just [age] years old. He described his childhood as 'amazing'. He was an only child and lived with his mother in [place] until high school. He moved back to [place] to live with his father during his high school years. The patient graduated from [place] in 2003 with a Communications degree. 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 He went into the office occasionally, but worked from home for the most part. The patient admitted this isolation made it easier to use. The patient denied any experience of trauma and  that he had been very fortunate compared with many of the stories he had heard while in [place]. The patient reported he could not really exercise because of his physical problems. His orthopedic surgeon will not consider a knee replacement until he is 37 yo. He acknowledges that some days can be very difficult with considerable pain, but he dismisses this pain, stating 'it could be much worse". The only problem the patient identified was sleeplessness. He sleeps only 3-5 hours per night. This is likely due to his long-term opiate use. He reported Seroquel and Trazadone did not help. The patient was pleasant throughout the assessment and agreed to return tomorrow to complete the orientation and enter the [facility]. His sobriety date = 10/10/17.    He started the program today 11/13/2017. He continues to have some muscle cramping and leg restlessness at night along with his chronic insomnia (Goes to sleep but awakens several hrs later and cant go back). He also has had some cravings that so far are manageable. He reports that he has committed himself to being clean and sober.He is participating in [place] He has begun repairing the lost trust in his marriage and says that their relationship is better already. Associated Signs/Symptoms:  DSM V SUD CRITERIA 9/11 + for Alcohol and Opioida ASAM score 12 Level II  Depression Symptoms:  PHQ 2 score 0 (Hypo) Manic Symptoms:  Irritable Mood,Substance withdrawal related Anxiety Symptoms:  GAD 7 score 5 Mild anxiety Psychotic Symptoms:  None PTSD Symptoms: Negative  Past Psychiatric History: None  Previous Psychotropic Medications: No  Substance Abuse History in the last 12 months:   : Substance Age of 1st Use Last Use Amount Specific Type  Nicotine Never     Alcohol 12 yrs 10/09/2017 1 pint whiskey  Cannabis 12 09/24/2017 2 days/wk POT  Opiates 16 10/09/2017  #30 10 mg/QD Oxycodone  Cocaine _0 lines Powder  Methamphetamines      LSD [age] 2x Oral  Ecstasy       Benzodiazepines  2002 Occasional   Caffeine      Inhalants      Others:                          Consequences of Substance Abuse: Medical Consequences:  LFTs elevated/Opiate and alcohol WD syndrome(9days detox) Legal Consequences:  NA Family Consequences:  Marital discord Blackouts:  Rare DT's: NA Withdrawal Symptoms:   Cramps Diaphoresis Diarrhea Nausea Tremors Vomiting Anxiety/Cravings  Past Medical History:  Past Medical History:  Diagnosis Date  . DJD (degenerative joint disease) of knee    LT-needs TKR age 61  . Partial tear of left Achilles tendon 07/2015  . Patellar tendon avulsion, left, sequela 2017   Donor graft repair  . Psoriasis     Past Surgical History:  Procedure Laterality Date  . ACHILLES TENDON SURGERY Left 08/24/2015   Procedure: ACHILLES TENDON REPAIR;  Surgeon: Dorna Leitz, MD;  Location: [facility];  Service: Orthopedics;  Laterality: Left;  Achilles  . CYST EXCISION     neck  . FOOT FRACTURE SURGERY Childhood   . KNEE CARTILAGE SURGERY Left 2017  . NASAL SEPTUM SURGERY    . PATELLAR TENDON REPAIR Left 2017   Donor graft    Family Psychiatric History: PGF deceased of Alcoholism age 47-41  Family History:  Family History  Problem Relation Age of Onset  . Diabetes Alcoholism Father PGF     Social History:    Social History   Socioeconomic History  . Marital status: Married x 5 yrs    Spouse name:   . Number of children: 1 Daughter Lyndee Leo age 43 months  . Years of education: College Degree [employer]  . Highest education level: College Degree [employer]  Social Needs  . Financial resource strain: None  . Food insecurity - worry: None  . Food insecurity - inability: None  . Transportation needs - medical: None  . Transportation needs - non-medical: None  Occupational History  . Financial Anylist for [employer]  Tobacco Use  . Smoking status: Never Smoker  . Smokeless tobacco:  Never Used  Substance and Sexual Activity  . Alcohol use: Yes    Daily x 10 yrs 1 pint liquor  . Drug use: 10 yrs Opiate dependency Oxycodone up to 30 pills/day  . Sexual activity: Married  Other Topics Concern  . Currently residing in [facility]  Social History Narrative  . Description of patient's relationship with caregiver when they were a child: Divorced, living in separate cities Patient's description of current relationship with people who raised him/her: Not close with father; close with mother How were you disciplined  when you got in trouble as a child/adolescent?: Appropriately Does patient have siblings?: No Marital status: Married Number of Years Married: 4 What types of issues is patient dealing with in the relationship?: Regaining trust, communication Are you sexually active?: Yes What is your sexual orientation?: Heterosexual Has your sexual activity been affected by drugs, alcohol, medication, or emotional stress?: No Does patient have children?: Yes How many children?: 1 How is patient's relationship with their children?: Positive     Additional Social History:   Religion: Religion/Spirituality Are You A Religious Person?: No  Leisure/Recreation: Leisure / Recreation Leisure and Hobbies: Patient has physical health issues, which prevent him from being active  Exercise/Diet: Exercise/Diet Do You Exercise?: No Have You Gained or Lost A Significant Amount of Weight in the Past Six Months?: Yes-Lost Number of Pounds Lost?: 30 Do You Follow a Special Diet?: No Do You Have Any Trouble Sleeping?: Yes Explanation of Sleeping Difficulties: Can't sleep more than about 5 hours    Allergies:  No Known Allergies  Metabolic Disorder Labs:Not available thru Epic Request from [facility] No results found for: HGBA1C, MPG No results found for: PROLACTIN NA No results found for: CHOL, TRIG, HDL, CHOLHDL, VLDL, LDLCALC   Current  Medications: Current Outpatient Medications  Medication Sig Dispense Refill  . baclofen (LIORESAL) 10 MG tablet Take 1 tablet (10 mg total) by mouth 3 (three) times daily. 90 tablet 1  . calcipotriene-betamethasone (TACLONEX) ointment calcipotriene-betamethasone 0.005 %-0.064 % topical ointment  APPLY TOPICALLY DAILY FOR 30 DAYS.    . Influenza vac split quadrivalent PF (FLUARIX) 0.5 ML injection Fluarix Quad 2017-2018 (PF) 60 mcg (15 mcg x 4)/0.5 mL IM syringe  TO BE ADMINISTERED BY PHARMACIST FOR IMMUNIZATION    . mirtazapine (REMERON) 15 MG tablet Take 1-2 tablets 20 minutes before bedtime 60 tablet 1  . [place] 380 MG SUSR INJECT 380MG IM EVERY 4 WEEKS OR ONCE A MONTH  12   Current Facility-Administered Medications  Medication Dose Route Frequency Provider Last Rate Last Dose  . Naltrexone SUSR 380 mg  380 mg Intramuscular Q30 days Dara Hoyer, PA-C        Neurologic: Headache: Negative Seizure: Negative Paresthesias:Negative  Musculoskeletal: Strength & Muscle Tone: abnormal limps favoring Lt knee Gait & Station: as above Patient leans: N/A  Psychiatric Specialty Exam: Review of Systems  Constitutional: Negative for chills, diaphoresis, fever, malaise/fatigue and weight loss.  HENT: Negative for congestion, ear discharge, ear pain, hearing loss, nosebleeds, sinus pain, sore throat and tinnitus.   Eyes: Negative for blurred vision, double vision, photophobia, pain, discharge and redness.  Respiratory: Negative for cough, hemoptysis, sputum production, shortness of breath, wheezing and stridor.   Cardiovascular: Negative for chest pain, palpitations, orthopnea, claudication, leg swelling and PND.  Gastrointestinal: Negative for abdominal pain, blood in stool, constipation, diarrhea, heartburn, melena, nausea and vomiting.  Genitourinary: Negative for dysuria, flank pain, frequency, hematuria and urgency.  Musculoskeletal: Positive for joint pain. Negative for back pain,  falls and neck pain.  Skin: Positive for rash (Psoriasis). Negative for itching.  Neurological: Negative for dizziness, tingling, tremors, sensory change, speech change, focal weakness, seizures, loss of consciousness, weakness and headaches.  Endo/Heme/Allergies: Negative for environmental allergies and polydipsia. Does not bruise/bleed easily.  Psychiatric/Behavioral: Positive for substance abuse. Negative for depression, hallucinations, memory loss and suicidal ideas. The patient has insomnia (Chronic). The patient is not nervous/anxious.     Blood pressure (!) 135/92, pulse 79, height _0  (1.854 m), weight 222 lb 6.4  oz (100.9 kg).Body mass index is 29.34 kg/m.  General Appearance: Casual and Well Groomed  Eye Contact:  Good  Speech:  Clear and Coherent  Volume:  Normal  Mood:  Euthymic  Affect:  Full Range  Thought Process:  Coherent, Goal Directed and Descriptions of Associations: Intact  Orientation:  Full (Time, Place, and Person)  Thought Content:  WDL and Some craving  Suicidal Thoughts:  No  Homicidal Thoughts:  No  Memory:  Hx of Blackouts  Judgement:  Other:  Hx of Blackouts  Insight:  Fair  Psychomotor Activity:  Normal  Concentration:  Concentration: Good and Attention Span: Good  Recall:  [place] of Knowledge:Good  Language: Good  Akathisia:  NA  Handed:  Right  AIMS (if indicated):  NA  Assets:  Communication Skills Desire for Improvement Financial Resources/Insurance Housing Resilience Social Support Talents/Skills Transportation Vocational/Educational  ADL's:  Intact  Cognition: Impaired,  Moderate due to addictions  Sleep:  Chronic insomnia    Treatment Plan Summary: Treatment Plan/Recommendations:  Plan of Care: Alcohol and Opiate SUDS and core issues [facility] CD IOP-see Counselor's individualized treatment program  Chronic pain- FU with Ortho-avoid Opiates including Ultram  Psoriasis-FU with Dermatology Insomnia-recomend sleep study -refer on  completion of CD IOP  Laboratory:  UDS per protocol  Psychotherapy:CD IOP Group;Individual and Family  Medications: See list Baclofen MAT and Remeron for sleep added today  Routine PRN Medications:  No  Consultations: Sleep at discharge  Safety Concerns: Relapse  Other: Weekly Team staffing  ? Psychological development effects of parents divorce and psoriasis on pt Request Labs from [place], [place] 2/25/20199:26 AM

## 2017-11-14 ENCOUNTER — Other Ambulatory Visit (HOSPITAL_COMMUNITY): Payer: BC Managed Care – PPO

## 2017-11-14 ENCOUNTER — Telehealth (HOSPITAL_COMMUNITY): Payer: Self-pay | Admitting: Psychology

## 2017-11-14 ENCOUNTER — Encounter (HOSPITAL_COMMUNITY): Payer: Self-pay | Admitting: Psychology

## 2017-11-16 ENCOUNTER — Encounter (HOSPITAL_COMMUNITY): Payer: Self-pay | Admitting: Psychology

## 2017-11-16 NOTE — Progress Notes (Signed)
    Daily Group Progress Note  Program: CD-IOP   11/16/2017 [facility] [id number]  Diagnosis:  Opioid use disorder, severe, dependence (HCC)  Alcohol use disorder, severe, dependence (HCC)  Chronic pain syndrome  Patellar tendon avulsion, left, subsequent encounter  Post-traumatic osteoarthritis of left knee  Psoriasis  Family history of alcoholism in paternal grandfather   Sobriety Date: 10/10/17  Group Time: 1-2:30pm  Participation Level: Active  Behavioral Response: Sharing  Type of Therapy: Process Group  Interventions: Supportive  Topic: Process: the first half of group was spent in process. Members shared about any challenges or successes they had experienced since we last met. included in their disclosures were the meetings they attended any anything else they did to support their recovery or promote self-care. A new group member was present today and he introduced himself and described what had brought him here. A drug test was collected from him. Three group members were absent today.   Group Time: 2:30-4pm  Participation Level: Minimal  Behavioral Response: Appropriate  Type of Therapy: Psycho-education Group  Interventions:  Spirituality  Topic: Psycho-Ed: Visit with the Chaplain; Spirituality. The second half of group was spent in a psycho-ed. A chaplain visited the group today and spoke on the topic of spirituality. She provided handouts and shared about different ways people experience spirituality. The chaplain led the group on a guided meditation with a discussion following. Members shared how they experienced spirituality. The session was engaging, revealing and members responded favorably to the visit  Summary: The patient was new to the group. He was asked to share a little about himself and he described a long history of addiction to alcohol and opiates and his recent completion of treatment at [facility]. He reported he has decided to  live in an [place] for the time being and not go right back to his home, his wife and infant daughter. The patient reported he wished he had gone to [place] "a decade earlier" and admitted he felt 'guilty'. The patient was well-received by the group for his honesty. The patient noted that he worked at home and the isolation certainly promoted the disease of addiction. He kept it a secret for a very long time. During the psycho-ed, the patient met with the medical director. He returned as the chaplain's visit was ending. A drug test was collected from the new group member today. He seemed fairly comfortable and shared openly in this first group session. He assured the counselor he would return tomorrow.   UDS collected: Yes Results: pending  AA/NA attended?: YesTuesday  Sponsor?: Yes   Brandon Melnick, LCAS 11/16/2017 2:12 PM

## 2017-11-18 ENCOUNTER — Other Ambulatory Visit (HOSPITAL_COMMUNITY): Payer: BC Managed Care – PPO

## 2017-11-18 ENCOUNTER — Telehealth (HOSPITAL_COMMUNITY): Payer: Self-pay | Admitting: Psychology

## 2017-11-18 ENCOUNTER — Other Ambulatory Visit (HOSPITAL_COMMUNITY): Payer: Self-pay

## 2017-11-18 MED ORDER — VIVITROL 380 MG IM SUSR: 380.0000 mg | INTRAMUSCULAR | 12 refills | Status: AC

## 2017-11-18 NOTE — Progress Notes (Signed)
Mickel DuhamelStephen B Blumenstock is a 37 y.o. male patient. CD-IOP: Absent. The patient appeared for group today. He shared in process and [facility]. He was engaged and responsive. At the start of the psycho-ed, he was nowhere to be found. He later left a message explaining his disappearance. He will not be given credit for today's session. A drug test will be administered on Monday.         Charmian MuffAnn Yee Gangi, LCAS

## 2017-11-20 ENCOUNTER — Other Ambulatory Visit (HOSPITAL_COMMUNITY): Payer: BC Managed Care – PPO

## 2017-11-21 ENCOUNTER — Telehealth (HOSPITAL_COMMUNITY): Payer: Self-pay | Admitting: Psychology

## 2017-11-21 ENCOUNTER — Other Ambulatory Visit (HOSPITAL_COMMUNITY): Payer: BC Managed Care – PPO

## 2017-11-25 ENCOUNTER — Other Ambulatory Visit (HOSPITAL_BASED_OUTPATIENT_CLINIC_OR_DEPARTMENT_OTHER): Payer: BC Managed Care – PPO | Admitting: Medical

## 2017-11-25 ENCOUNTER — Encounter (HOSPITAL_COMMUNITY): Payer: Self-pay | Admitting: Medical

## 2017-11-25 DIAGNOSIS — F1020 Alcohol dependence, uncomplicated: Secondary | ICD-10-CM | POA: Diagnosis not present

## 2017-11-25 DIAGNOSIS — S86892D Other injury of other muscle(s) and tendon(s) at lower leg level, left leg, subsequent encounter: Secondary | ICD-10-CM

## 2017-11-25 DIAGNOSIS — M1732 Unilateral post-traumatic osteoarthritis, left knee: Secondary | ICD-10-CM

## 2017-11-25 DIAGNOSIS — L409 Psoriasis, unspecified: Secondary | ICD-10-CM | POA: Diagnosis not present

## 2017-11-25 DIAGNOSIS — Z811 Family history of alcohol abuse and dependence: Secondary | ICD-10-CM | POA: Insufficient documentation

## 2017-11-25 DIAGNOSIS — F1120 Opioid dependence, uncomplicated: Secondary | ICD-10-CM

## 2017-11-25 DIAGNOSIS — Z9119 Patient's noncompliance with other medical treatment and regimen: Secondary | ICD-10-CM | POA: Insufficient documentation

## 2017-11-25 DIAGNOSIS — G894 Chronic pain syndrome: Secondary | ICD-10-CM

## 2017-11-25 DIAGNOSIS — Z9111 Patient's noncompliance with dietary regimen: Secondary | ICD-10-CM

## 2017-11-25 DIAGNOSIS — Z6372 Alcoholism and drug addiction in family: Secondary | ICD-10-CM | POA: Diagnosis not present

## 2017-11-25 DIAGNOSIS — S76192D Other specified injury of left quadriceps muscle, fascia and tendon, subsequent encounter: Secondary | ICD-10-CM | POA: Insufficient documentation

## 2017-11-25 DIAGNOSIS — X58XXXD Exposure to other specified factors, subsequent encounter: Secondary | ICD-10-CM | POA: Insufficient documentation

## 2017-11-25 NOTE — Progress Notes (Signed)
Date of Admission: 11/13/2017 Date of Discharge: 11/25/2017 Admission Diagnosis: 1. Opioid use disorder, severe, dependence (HCC)  2. Alcohol use disorder, severe, dependence (HCC)  3. Chronic pain syndrome  4. Patellar tendon avulsion, left, subsequent encounter  5. Post-traumatic osteoarthritis of left knee  6. Psoriasis  7. Family history of alcoholism in paternal grandfather  Course of Treatment:  11/14/2017. CD-IOP: Absent. The patient appeared for group today. He shared in process and talked about [facility]. He was engaged and responsive. At the start of the psycho-ed, he was nowhere to be found. He later left a message explaining his disappearance. He will not be given credit for today's session. A drug test will be administered on Monday.   As of today March 4,2019 pt never returned for Group sessions (Mon 2/25 Weds 2/27, Thurs 2/28 Mon 3/4 2019) nor did he return phone calls inquiring about his condition/situation.Using his signed ROI for his wife ,she was has been contacted but she has also failed to respond.  Per signed treatment contract pt is discharged for 3 unexcused abscences and the associated failure to provide UDS, attend individual counseling and provide accountability for outside meeeting requirements  Efforts to discover whereabouts and safety condition of patient will continue. Insurance will be notified as well.    Medications:  Discharge Diagnosis: 0 Opioid use disorder, severe, dependence (HCC)  0 Alcohol use disorder, severe, dependence (HCC)  0 Chronic pain syndrome  0 Patellar tendon avulsion, left, subsequent encounter  0 Post-traumatic osteoarthritis of left knee  0 Psoriasis  0 Family history of alcoholism in paternal grandfather  0 Noncompliance with therapeutic plan      Plan of Action to Address Continuing Problems:   Goals and Activities to Help Maintain Sobriety:  1. Stay away from people ,places and things that are  triggers 2. Continue practicing Fair Fighting rules in interpersonal conflicts. 3. Continue alcohol and drug refusal skills and call on support systems 4. Attend AA/NA meetings AT LEAST as often as you use  5. Obtain a sponsor and a home group in AA/NA. 6. Return to employment  Referrals: NA   Aftercare services: 5:30 pm [facility] OP NA  Next appointment: Unknown   Client has NOT participated in the development of this discharge plan and has NOT received a copy of this completed plan

## 2017-11-27 ENCOUNTER — Other Ambulatory Visit (HOSPITAL_COMMUNITY): Payer: BC Managed Care – PPO

## 2017-11-28 ENCOUNTER — Other Ambulatory Visit (HOSPITAL_COMMUNITY): Payer: BC Managed Care – PPO

## 2017-12-02 ENCOUNTER — Other Ambulatory Visit (HOSPITAL_COMMUNITY): Payer: BC Managed Care – PPO

## 2017-12-04 ENCOUNTER — Other Ambulatory Visit (HOSPITAL_COMMUNITY): Payer: BC Managed Care – PPO

## 2017-12-05 ENCOUNTER — Other Ambulatory Visit (HOSPITAL_COMMUNITY): Payer: BC Managed Care – PPO

## 2017-12-09 ENCOUNTER — Other Ambulatory Visit (HOSPITAL_COMMUNITY): Payer: BC Managed Care – PPO

## 2017-12-11 ENCOUNTER — Other Ambulatory Visit (HOSPITAL_COMMUNITY): Payer: BC Managed Care – PPO

## 2017-12-12 ENCOUNTER — Other Ambulatory Visit (HOSPITAL_COMMUNITY): Payer: BC Managed Care – PPO

## 2017-12-16 ENCOUNTER — Other Ambulatory Visit (HOSPITAL_COMMUNITY): Payer: BC Managed Care – PPO

## 2018-06-19 DIAGNOSIS — M25562 Pain in left knee: Secondary | ICD-10-CM | POA: Diagnosis not present

## 2018-06-19 DIAGNOSIS — M25462 Effusion, left knee: Secondary | ICD-10-CM | POA: Diagnosis not present

## 2019-02-09 ENCOUNTER — Other Ambulatory Visit: Payer: Self-pay

## 2019-02-09 ENCOUNTER — Emergency Department
Admission: EM | Admit: 2019-02-09 | Discharge: 2019-02-09 | Disposition: A | Discharge status: Home / Self Care | Payer: 59 | Attending: Emergency Medicine | Admitting: Emergency Medicine

## 2019-02-09 DIAGNOSIS — R69 Illness, unspecified: Secondary | ICD-10-CM | POA: Diagnosis not present

## 2019-02-09 DIAGNOSIS — Z5321 Procedure and treatment not carried out due to patient leaving prior to being seen by health care provider: Secondary | ICD-10-CM | POA: Diagnosis not present

## 2019-02-09 DIAGNOSIS — F1123 Opioid dependence with withdrawal: Secondary | ICD-10-CM | POA: Insufficient documentation

## 2019-02-09 LAB — CBC WITH DIFFERENTIAL/PLATELET
Abs Immature Granulocytes: 0.03 10*3/uL (ref 0.00–0.07)
Basophils Absolute: 0 10*3/uL (ref 0.0–0.1)
Basophils Relative: 0 %
Eosinophils Absolute: 0.4 10*3/uL (ref 0.0–0.5)
Eosinophils Relative: 5 %
HCT: 41.1 % (ref 39.0–52.0)
Hemoglobin: 13.7 g/dL (ref 13.0–17.0)
Immature Granulocytes: 0 %
Lymphocytes Relative: 20 %
Lymphs Abs: 1.9 10*3/uL (ref 0.7–4.0)
MCH: 27.9 pg (ref 26.0–34.0)
MCHC: 33.3 g/dL (ref 30.0–36.0)
MCV: 83.7 fL (ref 80.0–100.0)
Monocytes Absolute: 0.7 10*3/uL (ref 0.1–1.0)
Monocytes Relative: 7 %
Neutro Abs: 6.2 10*3/uL (ref 1.7–7.7)
Neutrophils Relative %: 68 %
Platelets: 287 10*3/uL (ref 150–400)
RBC: 4.91 MIL/uL (ref 4.22–5.81)
RDW: 12.5 % (ref 11.5–15.5)
WBC: 9.1 10*3/uL (ref 4.0–10.5)
nRBC: 0 % (ref 0.0–0.2)

## 2019-02-09 LAB — COMPREHENSIVE METABOLIC PANEL
ALT: 20 U/L (ref 0–44)
AST: 20 U/L (ref 15–41)
Albumin: 4.4 g/dL (ref 3.5–5.0)
Alkaline Phosphatase: 76 U/L (ref 38–126)
Anion gap: 9 (ref 5–15)
BUN: 10 mg/dL (ref 6–20)
CO2: 24 mmol/L (ref 22–32)
Calcium: 9.5 mg/dL (ref 8.9–10.3)
Chloride: 105 mmol/L (ref 98–111)
Creatinine, Ser: 0.81 mg/dL (ref 0.61–1.24)
GFR calc Af Amer: >60 mL/min (ref >60)
GFR calc non Af Amer: >60 mL/min (ref >60)
Glucose, Bld: 113 mg/dL — ABNORMAL HIGH (ref 70–99)
Potassium: 3.8 mmol/L (ref 3.5–5.1)
Sodium: 138 mmol/L (ref 135–145)
Total Bilirubin: 0.6 mg/dL (ref 0.3–1.2)
Total Protein: 7.9 g/dL (ref 6.5–8.1)

## 2019-02-09 LAB — LIPASE, BLOOD: Lipase: 24 U/L (ref 11–51)

## 2019-02-09 LAB — ETHANOL: Alcohol, Ethyl (B): <10 mg/dL (ref <10)

## 2019-02-09 MED ORDER — ONDANSETRON 4 MG PO TBDP
4.0000 mg | ORAL_TABLET | Freq: Once | ORAL | Status: AC
  Administered 2019-02-09: 06:00:00 4 mg via ORAL

## 2019-02-09 MED ORDER — ONDANSETRON 4 MG PO TBDP
ORAL_TABLET | ORAL | Status: AC
  Filled 2019-02-09: qty 1

## 2019-02-09 NOTE — ED Notes (Signed)
Pt first call at 0730.

## 2019-02-09 NOTE — ED Notes (Signed)
Pt not answering when called in the WR, pt is not visualized.

## 2019-02-09 NOTE — ED Triage Notes (Signed)
Pt states he would like help with symptoms from opiate withdrawal. Pt states last used 24 hours ago. Pt appears in no acute distress, states has been vomiting and having diarrhea.

## 2019-02-09 NOTE — ED Notes (Signed)
Pt called from the WR with no response, not visualized in the WR. Will attempt 1 more time,.

## 2019-06-24 DIAGNOSIS — R69 Illness, unspecified: Secondary | ICD-10-CM | POA: Diagnosis not present

## 2019-06-25 DIAGNOSIS — R69 Illness, unspecified: Secondary | ICD-10-CM | POA: Diagnosis not present

## 2019-06-26 DIAGNOSIS — R69 Illness, unspecified: Secondary | ICD-10-CM | POA: Diagnosis not present

## 2019-06-27 DIAGNOSIS — R69 Illness, unspecified: Secondary | ICD-10-CM | POA: Diagnosis not present

## 2019-06-28 DIAGNOSIS — R69 Illness, unspecified: Secondary | ICD-10-CM | POA: Diagnosis not present

## 2019-06-29 DIAGNOSIS — R69 Illness, unspecified: Secondary | ICD-10-CM | POA: Diagnosis not present

## 2019-06-30 DIAGNOSIS — R69 Illness, unspecified: Secondary | ICD-10-CM | POA: Diagnosis not present

## 2019-07-01 DIAGNOSIS — R69 Illness, unspecified: Secondary | ICD-10-CM | POA: Diagnosis not present

## 2019-07-02 DIAGNOSIS — R69 Illness, unspecified: Secondary | ICD-10-CM | POA: Diagnosis not present

## 2019-07-03 DIAGNOSIS — R69 Illness, unspecified: Secondary | ICD-10-CM | POA: Diagnosis not present

## 2019-07-04 DIAGNOSIS — R69 Illness, unspecified: Secondary | ICD-10-CM | POA: Diagnosis not present

## 2019-07-05 DIAGNOSIS — R69 Illness, unspecified: Secondary | ICD-10-CM | POA: Diagnosis not present

## 2019-07-06 DIAGNOSIS — R69 Illness, unspecified: Secondary | ICD-10-CM | POA: Diagnosis not present

## 2019-07-07 DIAGNOSIS — R69 Illness, unspecified: Secondary | ICD-10-CM | POA: Diagnosis not present

## 2019-07-08 DIAGNOSIS — R69 Illness, unspecified: Secondary | ICD-10-CM | POA: Diagnosis not present

## 2019-07-09 DIAGNOSIS — R69 Illness, unspecified: Secondary | ICD-10-CM | POA: Diagnosis not present

## 2019-07-10 DIAGNOSIS — R69 Illness, unspecified: Secondary | ICD-10-CM | POA: Diagnosis not present

## 2019-07-11 DIAGNOSIS — R69 Illness, unspecified: Secondary | ICD-10-CM | POA: Diagnosis not present

## 2019-07-12 DIAGNOSIS — R69 Illness, unspecified: Secondary | ICD-10-CM | POA: Diagnosis not present

## 2019-07-13 DIAGNOSIS — R69 Illness, unspecified: Secondary | ICD-10-CM | POA: Diagnosis not present

## 2019-07-14 DIAGNOSIS — R69 Illness, unspecified: Secondary | ICD-10-CM | POA: Diagnosis not present

## 2019-07-15 DIAGNOSIS — R69 Illness, unspecified: Secondary | ICD-10-CM | POA: Diagnosis not present

## 2019-07-16 DIAGNOSIS — R69 Illness, unspecified: Secondary | ICD-10-CM | POA: Diagnosis not present

## 2019-07-17 DIAGNOSIS — R69 Illness, unspecified: Secondary | ICD-10-CM | POA: Diagnosis not present

## 2019-07-18 DIAGNOSIS — R69 Illness, unspecified: Secondary | ICD-10-CM | POA: Diagnosis not present

## 2019-07-19 DIAGNOSIS — R69 Illness, unspecified: Secondary | ICD-10-CM | POA: Diagnosis not present

## 2019-07-20 DIAGNOSIS — R69 Illness, unspecified: Secondary | ICD-10-CM | POA: Diagnosis not present

## 2019-07-21 DIAGNOSIS — R69 Illness, unspecified: Secondary | ICD-10-CM | POA: Diagnosis not present

## 2019-07-28 DIAGNOSIS — R69 Illness, unspecified: Secondary | ICD-10-CM | POA: Diagnosis not present

## 2019-07-28 DIAGNOSIS — F1020 Alcohol dependence, uncomplicated: Secondary | ICD-10-CM | POA: Diagnosis not present

## 2019-07-28 DIAGNOSIS — L409 Psoriasis, unspecified: Secondary | ICD-10-CM | POA: Diagnosis not present

## 2019-07-28 DIAGNOSIS — Z6835 Body mass index (BMI) 35.0-35.9, adult: Secondary | ICD-10-CM | POA: Diagnosis not present

## 2019-07-28 DIAGNOSIS — I10 Essential (primary) hypertension: Secondary | ICD-10-CM | POA: Diagnosis not present

## 2019-07-28 DIAGNOSIS — F3289 Other specified depressive episodes: Secondary | ICD-10-CM | POA: Diagnosis not present

## 2019-08-04 DIAGNOSIS — F1020 Alcohol dependence, uncomplicated: Secondary | ICD-10-CM | POA: Diagnosis not present

## 2019-08-04 DIAGNOSIS — L409 Psoriasis, unspecified: Secondary | ICD-10-CM | POA: Diagnosis not present

## 2019-08-04 DIAGNOSIS — I10 Essential (primary) hypertension: Secondary | ICD-10-CM | POA: Diagnosis not present

## 2019-08-04 DIAGNOSIS — R69 Illness, unspecified: Secondary | ICD-10-CM | POA: Diagnosis not present

## 2019-08-04 DIAGNOSIS — F3289 Other specified depressive episodes: Secondary | ICD-10-CM | POA: Diagnosis not present

## 2019-08-04 DIAGNOSIS — Z6835 Body mass index (BMI) 35.0-35.9, adult: Secondary | ICD-10-CM | POA: Diagnosis not present

## 2019-08-08 DIAGNOSIS — R6883 Chills (without fever): Secondary | ICD-10-CM | POA: Diagnosis not present

## 2019-08-08 DIAGNOSIS — Z20828 Contact with and (suspected) exposure to other viral communicable diseases: Secondary | ICD-10-CM | POA: Diagnosis not present

## 2019-08-08 DIAGNOSIS — L03317 Cellulitis of buttock: Secondary | ICD-10-CM | POA: Diagnosis not present

## 2019-08-08 DIAGNOSIS — Z8249 Family history of ischemic heart disease and other diseases of the circulatory system: Secondary | ICD-10-CM | POA: Diagnosis not present

## 2019-08-08 DIAGNOSIS — R109 Unspecified abdominal pain: Secondary | ICD-10-CM | POA: Diagnosis not present

## 2019-08-08 DIAGNOSIS — R000 Tachycardia, unspecified: Secondary | ICD-10-CM | POA: Diagnosis not present

## 2019-08-08 DIAGNOSIS — K610 Anal abscess: Secondary | ICD-10-CM | POA: Diagnosis not present

## 2019-08-08 DIAGNOSIS — D72829 Elevated white blood cell count, unspecified: Secondary | ICD-10-CM | POA: Diagnosis not present

## 2019-08-08 DIAGNOSIS — R509 Fever, unspecified: Secondary | ICD-10-CM | POA: Diagnosis not present

## 2019-08-09 DIAGNOSIS — K610 Anal abscess: Secondary | ICD-10-CM | POA: Diagnosis not present

## 2019-08-13 DIAGNOSIS — L409 Psoriasis, unspecified: Secondary | ICD-10-CM | POA: Diagnosis not present

## 2019-08-13 DIAGNOSIS — I10 Essential (primary) hypertension: Secondary | ICD-10-CM | POA: Diagnosis not present

## 2019-08-13 DIAGNOSIS — R69 Illness, unspecified: Secondary | ICD-10-CM | POA: Diagnosis not present

## 2019-08-13 DIAGNOSIS — Z6835 Body mass index (BMI) 35.0-35.9, adult: Secondary | ICD-10-CM | POA: Diagnosis not present

## 2019-08-13 DIAGNOSIS — F1020 Alcohol dependence, uncomplicated: Secondary | ICD-10-CM | POA: Diagnosis not present

## 2019-08-13 DIAGNOSIS — F3289 Other specified depressive episodes: Secondary | ICD-10-CM | POA: Diagnosis not present

## 2019-08-18 DIAGNOSIS — F3289 Other specified depressive episodes: Secondary | ICD-10-CM | POA: Diagnosis not present

## 2019-08-18 DIAGNOSIS — F1020 Alcohol dependence, uncomplicated: Secondary | ICD-10-CM | POA: Diagnosis not present

## 2019-08-18 DIAGNOSIS — Z6835 Body mass index (BMI) 35.0-35.9, adult: Secondary | ICD-10-CM | POA: Diagnosis not present

## 2019-08-18 DIAGNOSIS — R69 Illness, unspecified: Secondary | ICD-10-CM | POA: Diagnosis not present

## 2019-08-18 DIAGNOSIS — L409 Psoriasis, unspecified: Secondary | ICD-10-CM | POA: Diagnosis not present

## 2019-08-18 DIAGNOSIS — I10 Essential (primary) hypertension: Secondary | ICD-10-CM | POA: Diagnosis not present

## 2019-08-30 DIAGNOSIS — R509 Fever, unspecified: Secondary | ICD-10-CM | POA: Diagnosis not present

## 2019-08-30 DIAGNOSIS — Z6836 Body mass index (BMI) 36.0-36.9, adult: Secondary | ICD-10-CM | POA: Diagnosis not present

## 2019-08-30 DIAGNOSIS — E669 Obesity, unspecified: Secondary | ICD-10-CM | POA: Diagnosis not present

## 2019-08-30 DIAGNOSIS — Z20828 Contact with and (suspected) exposure to other viral communicable diseases: Secondary | ICD-10-CM | POA: Diagnosis not present

## 2019-08-30 DIAGNOSIS — K610 Anal abscess: Secondary | ICD-10-CM | POA: Diagnosis not present

## 2019-08-30 DIAGNOSIS — M1990 Unspecified osteoarthritis, unspecified site: Secondary | ICD-10-CM | POA: Diagnosis not present

## 2019-08-30 DIAGNOSIS — R000 Tachycardia, unspecified: Secondary | ICD-10-CM | POA: Diagnosis not present

## 2019-08-31 DIAGNOSIS — L02215 Cutaneous abscess of perineum: Secondary | ICD-10-CM | POA: Diagnosis not present

## 2019-08-31 DIAGNOSIS — K610 Anal abscess: Secondary | ICD-10-CM | POA: Diagnosis not present

## 2019-09-23 DIAGNOSIS — Z79899 Other long term (current) drug therapy: Secondary | ICD-10-CM | POA: Diagnosis not present

## 2019-09-23 DIAGNOSIS — K5730 Diverticulosis of large intestine without perforation or abscess without bleeding: Secondary | ICD-10-CM | POA: Diagnosis not present

## 2019-09-23 DIAGNOSIS — K610 Anal abscess: Secondary | ICD-10-CM | POA: Diagnosis not present

## 2019-09-23 DIAGNOSIS — K6289 Other specified diseases of anus and rectum: Secondary | ICD-10-CM | POA: Diagnosis not present

## 2019-09-23 DIAGNOSIS — Z9889 Other specified postprocedural states: Secondary | ICD-10-CM | POA: Diagnosis not present

## 2019-09-23 DIAGNOSIS — L02211 Cutaneous abscess of abdominal wall: Secondary | ICD-10-CM | POA: Diagnosis not present

## 2019-09-23 DIAGNOSIS — K611 Rectal abscess: Secondary | ICD-10-CM | POA: Diagnosis not present

## 2019-09-23 DIAGNOSIS — L539 Erythematous condition, unspecified: Secondary | ICD-10-CM | POA: Diagnosis not present

## 2019-09-23 DIAGNOSIS — Z20828 Contact with and (suspected) exposure to other viral communicable diseases: Secondary | ICD-10-CM | POA: Diagnosis not present

## 2019-10-22 DIAGNOSIS — D72829 Elevated white blood cell count, unspecified: Secondary | ICD-10-CM | POA: Diagnosis not present

## 2019-10-22 DIAGNOSIS — K611 Rectal abscess: Secondary | ICD-10-CM | POA: Diagnosis not present

## 2019-10-22 DIAGNOSIS — Z20822 Contact with and (suspected) exposure to covid-19: Secondary | ICD-10-CM | POA: Diagnosis not present

## 2019-10-22 DIAGNOSIS — K610 Anal abscess: Secondary | ICD-10-CM | POA: Diagnosis not present

## 2019-10-22 DIAGNOSIS — R630 Anorexia: Secondary | ICD-10-CM | POA: Diagnosis not present

## 2019-10-22 DIAGNOSIS — K5730 Diverticulosis of large intestine without perforation or abscess without bleeding: Secondary | ICD-10-CM | POA: Diagnosis not present

## 2019-10-22 DIAGNOSIS — Z79899 Other long term (current) drug therapy: Secondary | ICD-10-CM | POA: Diagnosis not present

## 2019-10-22 DIAGNOSIS — L732 Hidradenitis suppurativa: Secondary | ICD-10-CM | POA: Diagnosis not present

## 2019-10-22 DIAGNOSIS — Z6834 Body mass index (BMI) 34.0-34.9, adult: Secondary | ICD-10-CM | POA: Diagnosis not present

## 2019-11-02 DIAGNOSIS — K610 Anal abscess: Secondary | ICD-10-CM | POA: Diagnosis not present

## 2019-11-12 DIAGNOSIS — Z8719 Personal history of other diseases of the digestive system: Secondary | ICD-10-CM | POA: Diagnosis not present

## 2019-11-12 DIAGNOSIS — R69 Illness, unspecified: Secondary | ICD-10-CM | POA: Diagnosis not present

## 2019-11-12 DIAGNOSIS — M7660 Achilles tendinitis, unspecified leg: Secondary | ICD-10-CM | POA: Diagnosis not present

## 2019-11-12 DIAGNOSIS — L03317 Cellulitis of buttock: Secondary | ICD-10-CM | POA: Diagnosis not present

## 2019-11-12 DIAGNOSIS — R102 Pelvic and perineal pain: Secondary | ICD-10-CM | POA: Diagnosis not present

## 2019-11-12 DIAGNOSIS — M533 Sacrococcygeal disorders, not elsewhere classified: Secondary | ICD-10-CM | POA: Diagnosis not present

## 2019-11-12 DIAGNOSIS — K610 Anal abscess: Secondary | ICD-10-CM | POA: Diagnosis not present

## 2019-11-12 DIAGNOSIS — K6289 Other specified diseases of anus and rectum: Secondary | ICD-10-CM | POA: Diagnosis not present

## 2019-11-12 DIAGNOSIS — Z7289 Other problems related to lifestyle: Secondary | ICD-10-CM | POA: Diagnosis not present

## 2019-11-12 DIAGNOSIS — Z6834 Body mass index (BMI) 34.0-34.9, adult: Secondary | ICD-10-CM | POA: Diagnosis not present

## 2019-11-12 DIAGNOSIS — L732 Hidradenitis suppurativa: Secondary | ICD-10-CM | POA: Diagnosis not present

## 2019-11-15 DIAGNOSIS — R102 Pelvic and perineal pain: Secondary | ICD-10-CM | POA: Diagnosis not present

## 2019-11-15 DIAGNOSIS — K6289 Other specified diseases of anus and rectum: Secondary | ICD-10-CM | POA: Diagnosis not present

## 2019-11-15 DIAGNOSIS — D72829 Elevated white blood cell count, unspecified: Secondary | ICD-10-CM | POA: Diagnosis not present

## 2019-11-19 DIAGNOSIS — L03315 Cellulitis of perineum: Secondary | ICD-10-CM | POA: Diagnosis not present

## 2019-11-19 DIAGNOSIS — L03818 Cellulitis of other sites: Secondary | ICD-10-CM | POA: Diagnosis not present

## 2019-11-19 DIAGNOSIS — K610 Anal abscess: Secondary | ICD-10-CM | POA: Diagnosis not present

## 2019-11-19 DIAGNOSIS — R102 Pelvic and perineal pain: Secondary | ICD-10-CM | POA: Diagnosis not present

## 2019-12-18 DIAGNOSIS — K6289 Other specified diseases of anus and rectum: Secondary | ICD-10-CM | POA: Diagnosis not present

## 2019-12-18 DIAGNOSIS — L03317 Cellulitis of buttock: Secondary | ICD-10-CM | POA: Diagnosis not present

## 2019-12-18 DIAGNOSIS — M7660 Achilles tendinitis, unspecified leg: Secondary | ICD-10-CM | POA: Diagnosis not present

## 2019-12-18 DIAGNOSIS — Z6834 Body mass index (BMI) 34.0-34.9, adult: Secondary | ICD-10-CM | POA: Diagnosis not present

## 2019-12-18 DIAGNOSIS — R69 Illness, unspecified: Secondary | ICD-10-CM | POA: Diagnosis not present

## 2019-12-18 DIAGNOSIS — L0231 Cutaneous abscess of buttock: Secondary | ICD-10-CM | POA: Diagnosis not present

## 2019-12-18 DIAGNOSIS — L409 Psoriasis, unspecified: Secondary | ICD-10-CM | POA: Diagnosis not present

## 2019-12-27 DIAGNOSIS — L03317 Cellulitis of buttock: Secondary | ICD-10-CM | POA: Diagnosis not present

## 2019-12-27 DIAGNOSIS — K6289 Other specified diseases of anus and rectum: Secondary | ICD-10-CM | POA: Diagnosis not present

## 2019-12-30 ENCOUNTER — Other Ambulatory Visit: Payer: Self-pay | Admitting: *Deleted

## 2019-12-30 NOTE — Patient Outreach (Addendum)
[facility]) Care Management  12/30/2019  Frank Wall 02-17-1981 [id number]   Subjective: Telephone call to patient's home  / mobile number, no answer, message states voicemail has not been set up, and unable to leave a message.      Objective: Per KPN (Knowledge Performance Now, point of care tool) and chart review, patient has had no recent hospitalizations.   Patient has had the following ED visits at [facility] Emergency Department: on 4/42021 for Cellulitis of buttock, on 12/19/2019 for Cellulitis, abscess of buttock, Anal pain, on 11/19/2019 for Cellulitis, on 11/15/2019 for Perianal pain, on 11/12/2019 for Cellulitis of buttock, on 10/22/2019 for Perianal abscess, on 09/23/2019 for Perirectal abscess, on 08/30/2019 for Perianal abscess, and on 08/08/2019 for Perianal abscess.    Patient also has a history of I&D perianal abscess times 3 (last one on 10/22/2019), DJD, polysubstance abuse, Achilles tendonitis,  Alcohol use disorder, Opioid use disorder, Patellar tendon avulsion, left, sequela, hidradenitis, recurrent left-sided perianal abscess status post  I&D 08/31/2019, 08/09/2019, most recently 1/28/2,  and psoriasis.       Assessment: Received data referral on 12/11/2019.  Referral source: [employer] [facility] Care Management Director).   Referral reason: too many ED visit, non- urgent referral.   Screening  follow up pending patient contact.     Plan: RNCM will send unsuccessful outreach  letter, [facility] pamphlet, will call patient for 2nd telephone outreach attempt within 4 business days, screening follow up, and will proceed with case closure within 10 business days if no return call, after 3rd unsuccessful outreach call.      Frank Carrol H. Gardiner Barefoot, BSN, CCM [facility] Care Management [facility] Telephonic CM Phone: [phone] Fax: [phone]

## 2020-01-04 ENCOUNTER — Other Ambulatory Visit: Payer: Self-pay | Admitting: *Deleted

## 2020-01-04 NOTE — Patient Outreach (Signed)
[facility]) Care Management  01/04/2020  NUH LIPTON 1980/10/08 [id number]   Subjective: Telephone call to patient's home  / mobile number, no answer, message states voicemail has not been set up, and unable to leave a message.      Objective: Per KPN (Knowledge Performance Now, point of care tool) and chart review, patient has had no recent hospitalizations.   Patient has had the following ED visits at [facility] Emergency Department: on 4/42021 for Cellulitis of buttock, on 12/19/2019 for Cellulitis, abscess of buttock, Anal pain, on 11/19/2019 for Cellulitis, on 11/15/2019 for Perianal pain, on 11/12/2019 for Cellulitis of buttock, on 10/22/2019 for Perianal abscess, on 09/23/2019 for Perirectal abscess, on 08/30/2019 for Perianal abscess, and on 08/08/2019 for Perianal abscess.    Patient also has a history of I&D perianal abscess times 3 (last one on 10/22/2019), DJD, polysubstance abuse, Achilles tendonitis,  Alcohol use disorder, Opioid use disorder, Patellar tendon avulsion, left, sequela, hidradenitis, recurrent left-sided perianal abscess status post  I&D 08/31/2019, 08/09/2019, most recently 1/28/2,  and psoriasis.       Assessment: Received data referral on 12/11/2019.  Referral source: [employer] [facility] Care Management Director).   Referral reason: too many ED visit, non- urgent referral.   Screening  follow up pending patient contact.     Plan: RNCM has sent unsuccessful outreach  letter, [facility] pamphlet, will call patient for 3rd telephone outreach attempt within 4 business days, screening follow up, and will proceed with case closure within 10 business days if no return call, after 3rd unsuccessful outreach call.      Mashell Sieben H. Gardiner Barefoot, BSN, CCM [facility] Care Management [facility] Telephonic CM Phone: [phone] Fax: [phone]

## 2020-01-08 ENCOUNTER — Other Ambulatory Visit: Payer: Self-pay | Admitting: *Deleted

## 2020-01-08 NOTE — Patient Outreach (Signed)
[facility]) Care Management  01/08/2020  Frank Wall December 10, 1980 [id number]   Subjective:Telephone call to patient's home / mobile number, no answer, message states voicemail has not been set up, and unable to leave a message.      Objective:Per KPN (Knowledge Performance Now, point of care tool) and chart review,patient has had no recent hospitalizations. Patient has had the following ED visits at [facility] Emergency Department: on 4/42021 forCellulitis of buttock, on 12/19/2019 forCellulitis,abscess of buttock,Anal pain, on 11/19/2019 forCellulitis, on 11/15/2019 forPerianal pain, on 11/12/2019 forCellulitis of buttock, on 10/22/2019 forPerianal abscess, on 09/23/2019 forPerirectal abscess, on 08/30/2019 forPerianal abscess, and on 08/08/2019 forPerianal abscess. Patient also has a history of I&D perianal abscess times 3 (last one on 10/22/2019),DJD, polysubstance abuse, Achilles tendonitis,Alcohol use disorder,Opioid use disorder,Patellar tendon avulsion, left, sequela,hidradenitis, recurrent left-sided perianal abscessstatus postI&D 08/31/2019, 08/09/2019, most recently 1/28/2,and psoriasis.     Assessment: Received data referral on 12/11/2019. Referral source: [employer] [facility] Care Management Director). Referral reason: too many ED visit, non- urgent referral. Screening follow up pending patient contact.     Plan:RNCM has sent unsuccessful outreach letter, [facility] pamphlet, will proceed with case closure within 10 business days if no return call, after 3rd unsuccessful outreach call.      Shakema Surita H. Gardiner Barefoot, BSN, CCM [facility] Care Management [facility] Telephonic CM Phone: [phone] Fax: [phone]

## 2020-02-01 ENCOUNTER — Other Ambulatory Visit: Payer: Self-pay | Admitting: *Deleted

## 2020-02-01 NOTE — Patient Outreach (Signed)
[facility]) Care Management  02/01/2020  Frank Wall 06-19-1981 [id number]   No response from patient outreach attempts will proceed with case closure.    Objective:Per KPN (Knowledge Performance Now, point of care tool) and chart review,patient has had no recent hospitalizations. Patient has had the following ED visits at [facility] Emergency Department: on 4/42021 forCellulitis of buttock, on 12/19/2019 forCellulitis,abscess of buttock,Anal pain, on 11/19/2019 forCellulitis, on 11/15/2019 forPerianal pain, on 11/12/2019 forCellulitis of buttock, on 10/22/2019 forPerianal abscess, on 09/23/2019 forPerirectal abscess, on 08/30/2019 forPerianal abscess, and on 08/08/2019 forPerianal abscess. Patient also has a history of I&D perianal abscess times 3 (last one on 10/22/2019),DJD, polysubstance abuse, Achilles tendonitis,Alcohol use disorder,Opioid use disorder,Patellar tendon avulsion, left, sequela,hidradenitis, recurrent left-sided perianal abscessstatus postI&D 08/31/2019, 08/09/2019, most recently 1/28/2,and psoriasis.     Assessment: Received data referral on 12/11/2019. Referral source: [employer] Care Management Director). Referral reason: too many ED visit, non- urgent referral. Screening follow up not completed due to unable to contact patient and will proceed with case closure.     Plan:Case closure due to unable to reach.       Frank Wall H. Gardiner Barefoot, BSN, CCM [facility] Care Management [facility] Telephonic CM Phone: [phone] Fax: [phone]

## 2020-03-26 DIAGNOSIS — E669 Obesity, unspecified: Secondary | ICD-10-CM | POA: Diagnosis not present

## 2020-03-26 DIAGNOSIS — Z8719 Personal history of other diseases of the digestive system: Secondary | ICD-10-CM | POA: Diagnosis not present

## 2020-03-26 DIAGNOSIS — M1990 Unspecified osteoarthritis, unspecified site: Secondary | ICD-10-CM | POA: Diagnosis not present

## 2020-03-26 DIAGNOSIS — L409 Psoriasis, unspecified: Secondary | ICD-10-CM | POA: Diagnosis not present

## 2020-03-26 DIAGNOSIS — Z6837 Body mass index (BMI) 37.0-37.9, adult: Secondary | ICD-10-CM | POA: Diagnosis not present

## 2020-03-26 DIAGNOSIS — K610 Anal abscess: Secondary | ICD-10-CM | POA: Diagnosis not present

## 2020-03-26 DIAGNOSIS — Z20822 Contact with and (suspected) exposure to covid-19: Secondary | ICD-10-CM | POA: Diagnosis not present

## 2020-03-27 DIAGNOSIS — K610 Anal abscess: Secondary | ICD-10-CM | POA: Diagnosis not present

## 2020-04-19 DIAGNOSIS — K610 Anal abscess: Secondary | ICD-10-CM | POA: Diagnosis not present

## 2020-04-19 DIAGNOSIS — K611 Rectal abscess: Secondary | ICD-10-CM | POA: Diagnosis not present

## 2020-04-19 DIAGNOSIS — Z6836 Body mass index (BMI) 36.0-36.9, adult: Secondary | ICD-10-CM | POA: Diagnosis not present

## 2020-04-19 DIAGNOSIS — E669 Obesity, unspecified: Secondary | ICD-10-CM | POA: Diagnosis not present

## 2020-04-19 DIAGNOSIS — L539 Erythematous condition, unspecified: Secondary | ICD-10-CM | POA: Diagnosis not present

## 2020-04-19 DIAGNOSIS — Z791 Long term (current) use of non-steroidal anti-inflammatories (NSAID): Secondary | ICD-10-CM | POA: Diagnosis not present

## 2020-04-19 DIAGNOSIS — K6289 Other specified diseases of anus and rectum: Secondary | ICD-10-CM | POA: Diagnosis not present

## 2020-04-20 DIAGNOSIS — K611 Rectal abscess: Secondary | ICD-10-CM | POA: Diagnosis not present

## 2020-04-29 DIAGNOSIS — M1990 Unspecified osteoarthritis, unspecified site: Secondary | ICD-10-CM | POA: Diagnosis not present

## 2020-04-29 DIAGNOSIS — Z20822 Contact with and (suspected) exposure to covid-19: Secondary | ICD-10-CM | POA: Diagnosis not present

## 2020-04-29 DIAGNOSIS — Z6836 Body mass index (BMI) 36.0-36.9, adult: Secondary | ICD-10-CM | POA: Diagnosis not present

## 2020-04-29 DIAGNOSIS — Z79899 Other long term (current) drug therapy: Secondary | ICD-10-CM | POA: Diagnosis not present

## 2020-04-29 DIAGNOSIS — L409 Psoriasis, unspecified: Secondary | ICD-10-CM | POA: Diagnosis not present

## 2020-04-29 DIAGNOSIS — K610 Anal abscess: Secondary | ICD-10-CM | POA: Diagnosis not present

## 2020-05-01 DIAGNOSIS — M791 Myalgia, unspecified site: Secondary | ICD-10-CM | POA: Diagnosis not present

## 2020-05-01 DIAGNOSIS — R5383 Other fatigue: Secondary | ICD-10-CM | POA: Diagnosis not present

## 2020-05-01 DIAGNOSIS — R630 Anorexia: Secondary | ICD-10-CM | POA: Diagnosis not present

## 2020-05-01 DIAGNOSIS — K610 Anal abscess: Secondary | ICD-10-CM | POA: Diagnosis not present

## 2020-05-09 DIAGNOSIS — K611 Rectal abscess: Secondary | ICD-10-CM | POA: Diagnosis not present

## 2020-05-09 DIAGNOSIS — Z6836 Body mass index (BMI) 36.0-36.9, adult: Secondary | ICD-10-CM | POA: Diagnosis not present

## 2020-06-10 DIAGNOSIS — M7989 Other specified soft tissue disorders: Secondary | ICD-10-CM | POA: Diagnosis not present

## 2020-06-10 DIAGNOSIS — K610 Anal abscess: Secondary | ICD-10-CM | POA: Diagnosis not present

## 2020-06-10 DIAGNOSIS — M19072 Primary osteoarthritis, left ankle and foot: Secondary | ICD-10-CM | POA: Diagnosis not present

## 2020-06-10 DIAGNOSIS — M79672 Pain in left foot: Secondary | ICD-10-CM | POA: Diagnosis not present

## 2020-06-10 DIAGNOSIS — M7662 Achilles tendinitis, left leg: Secondary | ICD-10-CM | POA: Diagnosis not present

## 2020-06-10 DIAGNOSIS — S99929A Unspecified injury of unspecified foot, initial encounter: Secondary | ICD-10-CM | POA: Diagnosis not present

## 2020-06-10 DIAGNOSIS — Z6834 Body mass index (BMI) 34.0-34.9, adult: Secondary | ICD-10-CM | POA: Diagnosis not present

## 2020-06-10 DIAGNOSIS — M25572 Pain in left ankle and joints of left foot: Secondary | ICD-10-CM | POA: Diagnosis not present

## 2020-08-21 DIAGNOSIS — Z6834 Body mass index (BMI) 34.0-34.9, adult: Secondary | ICD-10-CM | POA: Diagnosis not present

## 2020-08-21 DIAGNOSIS — K610 Anal abscess: Secondary | ICD-10-CM | POA: Diagnosis not present

## 2020-08-21 DIAGNOSIS — Z20822 Contact with and (suspected) exposure to covid-19: Secondary | ICD-10-CM | POA: Diagnosis not present

## 2020-08-22 DIAGNOSIS — K611 Rectal abscess: Secondary | ICD-10-CM | POA: Diagnosis not present

## 2020-08-22 DIAGNOSIS — K610 Anal abscess: Secondary | ICD-10-CM | POA: Diagnosis not present

## 2020-09-25 ENCOUNTER — Emergency Department (HOSPITAL_COMMUNITY)
Admission: EM | Admit: 2020-09-25 | Discharge: 2020-09-25 | Disposition: A | Discharge status: Home / Self Care | Payer: No Typology Code available for payment source | Attending: Emergency Medicine | Admitting: Emergency Medicine

## 2020-09-25 ENCOUNTER — Other Ambulatory Visit: Payer: Self-pay

## 2020-09-25 ENCOUNTER — Encounter (HOSPITAL_COMMUNITY): Payer: Self-pay

## 2020-09-25 DIAGNOSIS — X58XXXA Exposure to other specified factors, initial encounter: Secondary | ICD-10-CM | POA: Diagnosis not present

## 2020-09-25 DIAGNOSIS — T189XXA Foreign body of alimentary tract, part unspecified, initial encounter: Secondary | ICD-10-CM | POA: Diagnosis not present

## 2020-09-25 DIAGNOSIS — Z5321 Procedure and treatment not carried out due to patient leaving prior to being seen by health care provider: Secondary | ICD-10-CM | POA: Diagnosis not present

## 2020-09-25 NOTE — ED Triage Notes (Signed)
Pt reports eating chicken a little while ago and now feels like there is chicken or bone stuck in his chest. Pt maintaining airway and secretions.

## 2020-09-25 NOTE — ED Notes (Signed)
Pt walked out of triage room and handed me stickers and wristband and stated that he was going to a different ER.
